# Patient Record
Sex: Male | Born: 1965 | Race: White | Hispanic: No | Marital: Married | Smoking: Never smoker
Health system: Southern US, Community
[De-identification: ages and names within clinical notes are randomized; demographics above are authoritative.]

## PROBLEM LIST (undated history)

## (undated) DIAGNOSIS — B019 Varicella without complication: Secondary | ICD-10-CM

## (undated) DIAGNOSIS — I639 Cerebral infarction, unspecified: Secondary | ICD-10-CM

## (undated) DIAGNOSIS — T7840XA Allergy, unspecified, initial encounter: Secondary | ICD-10-CM

## (undated) DIAGNOSIS — Z789 Other specified health status: Secondary | ICD-10-CM

## (undated) HISTORY — DX: Cerebral infarction, unspecified: I63.9

## (undated) HISTORY — DX: Allergy, unspecified, initial encounter: T78.40XA

## (undated) HISTORY — DX: Varicella without complication: B01.9

## (undated) HISTORY — PX: MOUTH SURGERY: SHX715

## (undated) HISTORY — PX: SKIN LESION EXCISION: SHX2412

---

## 2005-07-07 LAB — HM HIV SCREENING LAB: HM HIV SCREENING: NEGATIVE

## 2008-05-07 LAB — HM HIV SCREENING LAB: HM HIV Screening: NEGATIVE

## 2008-12-09 ENCOUNTER — Ambulatory Visit: Payer: Self-pay | Admitting: Family Medicine

## 2009-12-05 HISTORY — PX: COLONOSCOPY: SHX174

## 2010-01-29 ENCOUNTER — Ambulatory Visit: Payer: Self-pay | Admitting: Gastroenterology

## 2013-11-25 DIAGNOSIS — D229 Melanocytic nevi, unspecified: Secondary | ICD-10-CM

## 2013-11-25 HISTORY — DX: Melanocytic nevi, unspecified: D22.9

## 2016-03-28 ENCOUNTER — Telehealth: Payer: Self-pay | Admitting: Family Medicine

## 2016-03-28 NOTE — Telephone Encounter (Signed)
Please review-aa 

## 2016-03-28 NOTE — Telephone Encounter (Signed)
Today at 2 or  sometime tomorrow.

## 2016-03-28 NOTE — Telephone Encounter (Signed)
Pt has not been seen since 06/17/2012.  Pt thinks he may have a broken pinky finger and is requesting to see Dr Rosanna Randy.  Pt has not seen any other provider.  No Rx.  BCBS.  [id number]

## 2016-03-28 NOTE — Telephone Encounter (Signed)
Appointment scheduled for 03/29/2016@2 :30/MW

## 2016-03-28 NOTE — Telephone Encounter (Signed)
Please schedule.-aa 

## 2016-03-29 ENCOUNTER — Encounter: Payer: Self-pay | Admitting: Family Medicine

## 2016-03-29 ENCOUNTER — Ambulatory Visit (INDEPENDENT_AMBULATORY_CARE_PROVIDER_SITE_OTHER): Payer: BC Managed Care – PPO | Admitting: Family Medicine

## 2016-03-29 VITALS — BP 142/80 | HR 82 | Temp 98.0°F | Resp 14 | Ht 70.0 in | Wt 211.0 lb

## 2016-03-29 DIAGNOSIS — S63619A Unspecified sprain of unspecified finger, initial encounter: Secondary | ICD-10-CM | POA: Diagnosis not present

## 2016-03-29 DIAGNOSIS — S86002A Unspecified injury of left Achilles tendon, initial encounter: Secondary | ICD-10-CM

## 2016-03-29 MED ORDER — NAPROXEN 500 MG PO TABS: 500.0000 mg | ORAL_TABLET | Freq: Two times a day (BID) | ORAL | Status: DC

## 2016-03-29 NOTE — Progress Notes (Signed)
Patient ID: Nathaniel Gonzalez, male   DOB: December 11, 1965, 50 y.o.   MRN: [id number]    Subjective:  HPI Pt is re-establishing care today. 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 It is his left pinky finger. It is swollen and bruised/red. He can not bend it all the way or make a fist. It happened April 1st. He heard the finger pop,and crack.  He is also having pain on the left achilles area. 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 He could not walk right for about 4-5 days. He walked on the ball of his foot and did not "use" the tendon. It is better now but when he gets up out of a chair or when he gets up in the AM he can feel a pull on the back of his foot. Pt is concerned about injuring his foot further.     Prior to Admission medications   Not on File    Patient Active Problem List   Diagnosis Date Noted  . 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 Family history of colonic polyps 11/12/2008    History reviewed. No pertinent past medical history.  Social History   Social History  . Marital Status: Married    Spouse Name: N/A  . Number of Children: N/A  . Years of Education: N/A   Occupational History  . Not on file.   Social History Main Topics  . Smoking status: Never Smoker   . Smokeless tobacco: Not on file  . Alcohol Use: No  . Drug Use: No  . Sexual Activity: Not on file   Other Topics Concern  . Not on file   Social History Narrative    Allergies  Allergen Reactions  . Erythromycin   . Ketoconazole Rash    Review of Systems  Constitutional: Negative.   HENT: Negative.   Eyes: Negative.   Respiratory: Negative.   Cardiovascular: Negative.   Gastrointestinal: Negative.   Genitourinary: Negative.   Musculoskeletal: Positive for joint pain.  Skin: Negative.   Neurological: Negative.   Endo/Heme/Allergies: Negative.   Psychiatric/Behavioral: Negative.      Immunization History  Administered Date(s) Administered  . Tdap 11/12/2008   Objective:  BP 142/80 mmHg  Pulse 82  Temp(Src) 98 F (36.7 C) (Oral)  Resp 14  Ht 5\' 10"  (1.778 m)  Wt 211 lb (95.709 kg)  BMI 30.28 kg/m2  Physical Exam  Constitutional: He is oriented to person, place, and time and well-developed, well-nourished, and in no distress.  Eyes: Conjunctivae and EOM are normal. Pupils are equal, round, and reactive to light.  Neck: Normal range of motion. Neck supple.  Cardiovascular: Normal rate, regular rhythm, normal heart sounds and intact distal pulses.   Pulmonary/Chest: Effort normal and breath sounds normal.  Musculoskeletal: Normal range of motion. He exhibits edema (Fifth PIP joint swelling on left hand. Mild swelling of left achilles tendon).  Neurological: He is alert and oriented to person, place, and time. He has normal reflexes. Gait normal. GCS score is 15.  Skin: Skin is warm and dry.  Psychiatric: Mood, memory, affect and judgment normal.    No results found for: WBC, HGB, HCT, PLT, GLUCOSE, CHOL, TRIG, HDL, LDLDIRECT, LDLCALC, TSH, PSA, INR, GLUF, HGBA1C, MICROALBUR  CMP  No results found for: NA, K, CL, CO2, GLUCOSE, BUN, CREATININE, CALCIUM, PROT, ALBUMIN, AST, ALT, ALKPHOS, BILITOT, GFRNONAA, GFRAA  Assessment and Plan :  1. Finger sprain, initial encounter  - naproxen (NAPROSYN) 500 MG tablet; Take 1 tablet (500 mg total) by mouth 2 (two) times daily with a meal.  Dispense: 60 tablet; Refill: 5 - Ambulatory referral to Sports Medicine  2. Injury of left Achilles tendon, initial encounter  - Ambulatory referral to Sports Medicine   Patient was seen and examined by Dr. Miguel Aschoff, and noted scribed by Webb Laws, Dudley MD [facility] 03/29/2016 3:04 PM

## 2016-06-09 ENCOUNTER — Encounter: Payer: Self-pay | Admitting: Family Medicine

## 2016-06-09 ENCOUNTER — Ambulatory Visit (INDEPENDENT_AMBULATORY_CARE_PROVIDER_SITE_OTHER): Payer: BC Managed Care – PPO | Admitting: Family Medicine

## 2016-06-09 VITALS — BP 124/80 | HR 84 | Temp 98.0°F | Resp 16 | Ht 70.5 in | Wt 213.0 lb

## 2016-06-09 DIAGNOSIS — Z8371 Family history of colonic polyps: Secondary | ICD-10-CM

## 2016-06-09 DIAGNOSIS — Z0000 Encounter for general adult medical examination without abnormal findings: Secondary | ICD-10-CM

## 2016-06-09 DIAGNOSIS — Z125 Encounter for screening for malignant neoplasm of prostate: Secondary | ICD-10-CM | POA: Diagnosis not present

## 2016-06-09 DIAGNOSIS — R002 Palpitations: Secondary | ICD-10-CM | POA: Diagnosis not present

## 2016-06-09 NOTE — Progress Notes (Addendum)
Patient: Nathaniel Gonzalez, Male    DOB: [birth year], 50 y.o.   MRN: [id number] Visit Date: 06/09/2016  Today's Provider: Wilhemena Durie, MD   Chief Complaint  Patient presents with  . Annual Exam   Subjective:  Nathaniel Gonzalez is a 50 y.o. male who presents today for health maintenance and complete physical. He feels well. He reports exercising none. He reports he is sleeping well. Overall he feels well. His mother died this year from cancer. She was about 72. His father wa.s a heavy smoker and has severe ASCVD and COPD. Overall he feels well and wants to get plugged back in for health maintenance he is he has not done this for several years. His only real concerns today are occasional mild palpitations. He states that he occasionally gets "prostate infections". Also, as he is aged he has double cleaning up after bowel movements.   Review of Systems  Constitutional: Negative for appetite change.  Eyes: Negative.   Respiratory: Negative.   Cardiovascular: Positive for palpitations (Has had for years, more frequent over the last few years).  Gastrointestinal: Negative.   Endocrine: Negative.   Genitourinary: Negative.   Musculoskeletal: Negative.   Skin: Negative.   Allergic/Immunologic: Positive for environmental allergies and food allergies.  Neurological: Positive for headaches (allergy related).  Hematological: Negative.   Psychiatric/Behavioral: Negative.     Social History   Social History  . Marital Status: Married    Spouse Name: N/A  . Number of Children: N/A  . Years of Education: N/A   Occupational History  . Not on file.   Social History Main Topics  . Smoking status: Never Smoker   . Smokeless tobacco: Not on file  . Alcohol Use: No  . Drug Use: No  . Sexual Activity: Not on file   Other Topics Concern  . Not on file   Social History Narrative    Patient Active Problem List   Diagnosis Date Noted  . 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 Family  history of colonic polyps 11/12/2008    Past Surgical History  Procedure Laterality Date  . Mouth surgery    . Skin lesion excision      several, he goes yearly    His family history includes Aneurysm in his father; COPD in his father; Cancer in his cousin, maternal aunt, maternal grandmother, mother, and paternal grandmother; Colon polyps in his father and mother; Heart attack in his father; Heart disease in his father, maternal grandmother, and paternal grandfather; Hypertension in his brother; Skin cancer in his mother.    Outpatient Prescriptions Prior to Visit  Medication Sig Dispense Refill  . fluticasone (FLONASE) 50 MCG/ACT nasal spray Place 2 sprays into both nostrils daily. Reported on 06/09/2016    . naproxen (NAPROSYN) 500 MG tablet Take 1 tablet (500 mg total) by mouth 2 (two) times daily with a meal. (Patient not taking: Reported on 06/09/2016) 60 tablet 5   No facility-administered medications prior to visit.    Patient Care Team: Jerrol Banana., MD as PCP - General (Family Medicine)     Objective:   Vitals:  Filed Vitals:   06/09/16 0950  BP: 124/80  Pulse: 84  Temp: 98 F (36.7 C)  TempSrc: Oral  Resp: 16  Height: 5' 10.5" (1.791 m)  Weight: 213 lb (96.616 kg)    Physical Exam  Constitutional: He is oriented to person, place, and time. He appears well-developed and well-nourished.  HENT:  Head: Normocephalic and atraumatic.  Right Ear: External ear normal.  Left Ear: External ear normal.  Nose: Nose normal.  Mouth/Throat: Oropharynx is clear and moist.  Eyes: Conjunctivae and EOM are normal. Pupils are equal, round, and reactive to light.  Neck: Normal range of motion. Neck supple.  Cardiovascular: Normal rate, regular rhythm, normal heart sounds and intact distal pulses.   Pulmonary/Chest: Effort normal and breath sounds normal.  Abdominal: Soft. Bowel sounds are normal.  Genitourinary: Rectum normal, prostate normal and penis normal.   Musculoskeletal: Normal range of motion.  Neurological: He is alert and oriented to person, place, and time. He has normal reflexes.  Skin: Skin is warm and dry.  Psychiatric: He has a normal mood and affect. His behavior is normal. Judgment and thought content normal.     Depression Screen No flowsheet data found.    Assessment & Plan:     Routine Health Maintenance and Physical Exam  Exercise Activities and Dietary recommendations Goals    None      Immunization History  Administered Date(s) Administered  . Tdap 11/12/2008    Health Maintenance  Topic Date Due  . HIV Screening  10/08/1982  . INFLUENZA VACCINE  07/05/2016  . TETANUS/TDAP  11/12/2018   1. Annual physical exam  - CBC with Differential/Platelet - Comprehensive metabolic panel - Lipid Panel With LDL/HDL Ratio - TSH  2. Prostate cancer screening  - PSA  3. Palpitations Benign. Refer to cardiology at any point in time if patient is concerned. - EKG 12-Lead Start ASA 81 mg daily.  Return to clinic 1 year. Discussed health benefits of physical activity, and encouraged him to engage in regular exercise appropriate for his age and condition.  4.BPH 5.Colonic Polyps Per pt history--refer back to GI.  I have done the exam and reviewed the above chart and it is accurate to the best of my knowledge.  ------------------------------------------------------------------------------------------------------------

## 2016-06-09 NOTE — Patient Instructions (Signed)
Take Aspirin 81 mg daily  Take Metamucil daily for bowels  Referral being made to [facility] for follow up Colonoscopy

## 2016-06-10 ENCOUNTER — Telehealth: Payer: Self-pay

## 2016-06-10 LAB — CBC WITH DIFFERENTIAL/PLATELET
BASOS: 1 %
Basophils Absolute: 0 10*3/uL (ref 0.0–0.2)
EOS (ABSOLUTE): 0.2 10*3/uL (ref 0.0–0.4)
EOS: 3 %
HEMATOCRIT: 45.6 % (ref 37.5–51.0)
Hemoglobin: 16.4 g/dL (ref 12.6–17.7)
Immature Grans (Abs): 0 10*3/uL (ref 0.0–0.1)
Immature Granulocytes: 0 %
LYMPHS ABS: 2.2 10*3/uL (ref 0.7–3.1)
Lymphs: 38 %
MCH: 30.8 pg (ref 26.6–33.0)
MCHC: 36 g/dL — AB (ref 31.5–35.7)
MCV: 86 fL (ref 79–97)
MONOS ABS: 0.5 10*3/uL (ref 0.1–0.9)
Monocytes: 8 %
Neutrophils Absolute: 2.9 10*3/uL (ref 1.4–7.0)
Neutrophils: 50 %
Platelets: 219 10*3/uL (ref 150–379)
RBC: 5.33 x10E6/uL (ref 4.14–5.80)
RDW: 12.6 % (ref 12.3–15.4)
WBC: 5.8 10*3/uL (ref 3.4–10.8)

## 2016-06-10 LAB — COMPREHENSIVE METABOLIC PANEL
A/G RATIO: 1.8 (ref 1.2–2.2)
ALK PHOS: 64 IU/L (ref 39–117)
ALT: 40 IU/L (ref 0–44)
AST: 23 IU/L (ref 0–40)
Albumin: 4.6 g/dL (ref 3.5–5.5)
BILIRUBIN TOTAL: 0.8 mg/dL (ref 0.0–1.2)
BUN/Creatinine Ratio: 14 (ref 9–20)
BUN: 16 mg/dL (ref 6–24)
CO2: 25 mmol/L (ref 18–29)
Calcium: 9.7 mg/dL (ref 8.7–10.2)
Chloride: 102 mmol/L (ref 96–106)
Creatinine, Ser: 1.14 mg/dL (ref 0.76–1.27)
GFR calc Af Amer: 87 mL/min/{1.73_m2} (ref >59)
GFR calc non Af Amer: 76 mL/min/{1.73_m2} (ref >59)
GLOBULIN, TOTAL: 2.6 g/dL (ref 1.5–4.5)
Glucose: 94 mg/dL (ref 65–99)
POTASSIUM: 4.4 mmol/L (ref 3.5–5.2)
SODIUM: 143 mmol/L (ref 134–144)
Total Protein: 7.2 g/dL (ref 6.0–8.5)

## 2016-06-10 LAB — LIPID PANEL WITH LDL/HDL RATIO
CHOLESTEROL TOTAL: 213 mg/dL — AB (ref 100–199)
HDL: 37 mg/dL — ABNORMAL LOW (ref >39)
LDL Calculated: 127 mg/dL — ABNORMAL HIGH (ref 0–99)
LDl/HDL Ratio: 3.4 ratio units (ref 0.0–3.6)
TRIGLYCERIDES: 245 mg/dL — AB (ref 0–149)
VLDL Cholesterol Cal: 49 mg/dL — ABNORMAL HIGH (ref 5–40)

## 2016-06-10 LAB — TSH: TSH: 1.92 u[IU]/mL (ref 0.450–4.500)

## 2016-06-10 LAB — PSA: Prostate Specific Ag, Serum: 1.4 ng/mL (ref 0.0–4.0)

## 2016-06-10 NOTE — Addendum Note (Signed)
Addended by: Miguel Aschoff on: 06/10/2016 06:44 AM   Modules accepted: Miquel Dunn

## 2016-06-10 NOTE — Telephone Encounter (Signed)
Patient advised as below.  

## 2016-06-10 NOTE — Addendum Note (Signed)
Addended by: Miguel Aschoff on: 06/10/2016 06:46 AM   Modules accepted: Miquel Dunn

## 2016-06-10 NOTE — Telephone Encounter (Signed)
-----   Message from Jerrol Banana., MD sent at 06/10/2016  9:35 AM EDT ----- Labs okay. Work on diet and exercise to improve lipid profile

## 2016-06-17 ENCOUNTER — Telehealth: Payer: Self-pay

## 2016-06-17 NOTE — Telephone Encounter (Signed)
Patient will call me back to schedule a colonoscopy

## 2016-06-23 ENCOUNTER — Telehealth: Payer: Self-pay

## 2016-06-23 NOTE — Telephone Encounter (Signed)
Patient needs a colonoscopy for family history of polyps Z83.71. I tried calling his phone numbers but they are either disconnected or have a full voicemail. Sent patient a notification letter to the address provided on file.

## 2016-06-28 ENCOUNTER — Telehealth: Payer: Self-pay

## 2016-06-28 ENCOUNTER — Other Ambulatory Visit: Payer: Self-pay

## 2016-06-28 NOTE — Telephone Encounter (Signed)
Colonoscopy & Endoscopy scheduled 07/25/2016 [facility]  Screening Colonoscopy Z12.11 Dysphagia [id number]  Please pre cert

## 2016-06-28 NOTE — Telephone Encounter (Signed)
Gastroenterology Pre-Procedure Review  Request Date: 07/25/2016 Requesting Physician:  PATIENT REVIEW QUESTIONS: The patient responded to the following health history questions as indicated:    1. Are you having any GI issues? yes (Every now and then he has some rectal bleeding ) 2. Do you have a personal history of Polyps? no 3. Do you have a family history of Colon Cancer or Polyps? yes (mother, benign polyps ) 4. Diabetes Mellitus? no 5. Joint replacements in the past 12 months?no 6. Major health problems in the past 3 months?no 7. Any artificial heart valves, MVP, or defibrillator?no    MEDICATIONS & ALLERGIES:    Patient reports the following regarding taking any anticoagulation/antiplatelet therapy:   Plavix, Coumadin, Eliquis, Xarelto, Lovenox, Pradaxa, Brilinta, or Effient? no Aspirin? yes (Heart health )  Patient confirms/reports the following medications:  Current Outpatient Prescriptions  Medication Sig Dispense Refill  . aspirin 81 MG tablet Take 81 mg by mouth daily.    . fluticasone (FLONASE) 50 MCG/ACT nasal spray Place 2 sprays into both nostrils daily. Reported on 06/09/2016    . ketoconazole (NIZORAL) 2 % shampoo      No current facility-administered medications for this visit.     Patient confirms/reports the following allergies:  Allergies  Allergen Reactions  . Erythromycin     No orders of the defined types were placed in this encounter.   AUTHORIZATION INFORMATION Primary Insurance: 1D#: Group #:  Secondary Insurance: 1D#: Group #:  SCHEDULE INFORMATION: Date: 07/25/2016 Time: Location: [facility]

## 2016-07-06 ENCOUNTER — Observation Stay
Admit: 2016-07-06 | Discharge: 2016-07-06 | Disposition: A | Discharge status: Home / Self Care | Payer: BC Managed Care – PPO | Attending: Internal Medicine | Admitting: Internal Medicine

## 2016-07-06 ENCOUNTER — Inpatient Hospital Stay
Admission: EM | Admit: 2016-07-06 | Discharge: 2016-07-08 | DRG: 066 | Disposition: A | Discharge status: Home / Self Care | Payer: BC Managed Care – PPO | Attending: Internal Medicine | Admitting: Internal Medicine

## 2016-07-06 ENCOUNTER — Encounter: Payer: Self-pay | Admitting: *Deleted

## 2016-07-06 ENCOUNTER — Emergency Department: Payer: BC Managed Care – PPO

## 2016-07-06 ENCOUNTER — Encounter: Payer: Self-pay | Admitting: Family Medicine

## 2016-07-06 ENCOUNTER — Observation Stay: Payer: BC Managed Care – PPO

## 2016-07-06 ENCOUNTER — Ambulatory Visit (INDEPENDENT_AMBULATORY_CARE_PROVIDER_SITE_OTHER): Payer: BC Managed Care – PPO | Admitting: Family Medicine

## 2016-07-06 VITALS — BP 152/84 | HR 74 | Temp 98.1°F | Resp 20 | Wt 212.0 lb

## 2016-07-06 DIAGNOSIS — R42 Dizziness and giddiness: Secondary | ICD-10-CM

## 2016-07-06 DIAGNOSIS — R112 Nausea with vomiting, unspecified: Secondary | ICD-10-CM | POA: Diagnosis present

## 2016-07-06 DIAGNOSIS — I639 Cerebral infarction, unspecified: Secondary | ICD-10-CM | POA: Diagnosis not present

## 2016-07-06 DIAGNOSIS — Z881 Allergy status to other antibiotic agents status: Secondary | ICD-10-CM

## 2016-07-06 DIAGNOSIS — R29898 Other symptoms and signs involving the musculoskeletal system: Secondary | ICD-10-CM | POA: Diagnosis present

## 2016-07-06 DIAGNOSIS — R270 Ataxia, unspecified: Secondary | ICD-10-CM | POA: Diagnosis present

## 2016-07-06 DIAGNOSIS — Z8249 Family history of ischemic heart disease and other diseases of the circulatory system: Secondary | ICD-10-CM

## 2016-07-06 DIAGNOSIS — Z79899 Other long term (current) drug therapy: Secondary | ICD-10-CM

## 2016-07-06 DIAGNOSIS — R7301 Impaired fasting glucose: Secondary | ICD-10-CM | POA: Diagnosis present

## 2016-07-06 DIAGNOSIS — G8321 Monoplegia of upper limb affecting right dominant side: Secondary | ICD-10-CM | POA: Diagnosis present

## 2016-07-06 DIAGNOSIS — Z808 Family history of malignant neoplasm of other organs or systems: Secondary | ICD-10-CM

## 2016-07-06 DIAGNOSIS — H6691 Otitis media, unspecified, right ear: Secondary | ICD-10-CM | POA: Diagnosis present

## 2016-07-06 DIAGNOSIS — Z7982 Long term (current) use of aspirin: Secondary | ICD-10-CM

## 2016-07-06 HISTORY — DX: Other specified health status: Z78.9

## 2016-07-06 LAB — URINE DRUG SCREEN, QUALITATIVE (ARMC ONLY)
AMPHETAMINES, UR SCREEN: NOT DETECTED
BENZODIAZEPINE, UR SCRN: NOT DETECTED
Barbiturates, Ur Screen: NOT DETECTED
Cannabinoid 50 Ng, Ur ~~LOC~~: NOT DETECTED
Cocaine Metabolite,Ur ~~LOC~~: NOT DETECTED
MDMA (ECSTASY) UR SCREEN: NOT DETECTED
Methadone Scn, Ur: NOT DETECTED
Opiate, Ur Screen: NOT DETECTED
PHENCYCLIDINE (PCP) UR S: NOT DETECTED
TRICYCLIC, UR SCREEN: NOT DETECTED

## 2016-07-06 LAB — URINALYSIS COMPLETE WITH MICROSCOPIC (ARMC ONLY)
BACTERIA UA: NONE SEEN
Bilirubin Urine: NEGATIVE
Glucose, UA: NEGATIVE mg/dL
Hgb urine dipstick: NEGATIVE
KETONES UR: NEGATIVE mg/dL
Leukocytes, UA: NEGATIVE
Nitrite: NEGATIVE
PH: 6 (ref 5.0–8.0)
PROTEIN: NEGATIVE mg/dL
RBC / HPF: NONE SEEN RBC/hpf (ref 0–5)
SQUAMOUS EPITHELIAL / LPF: NONE SEEN
Specific Gravity, Urine: 1.014 (ref 1.005–1.030)

## 2016-07-06 LAB — CBC
HEMATOCRIT: 47.7 % (ref 40.0–52.0)
HEMOGLOBIN: 17.2 g/dL (ref 13.0–18.0)
MCH: 30.6 pg (ref 26.0–34.0)
MCHC: 36.1 g/dL — AB (ref 32.0–36.0)
MCV: 84.9 fL (ref 80.0–100.0)
Platelets: 251 10*3/uL (ref 150–440)
RBC: 5.62 MIL/uL (ref 4.40–5.90)
RDW: 12.7 % (ref 11.5–14.5)
WBC: 8.3 10*3/uL (ref 3.8–10.6)

## 2016-07-06 LAB — BASIC METABOLIC PANEL
ANION GAP: 10 (ref 5–15)
BUN: 13 mg/dL (ref 6–20)
CALCIUM: 9.4 mg/dL (ref 8.9–10.3)
CHLORIDE: 104 mmol/L (ref 101–111)
CO2: 23 mmol/L (ref 22–32)
Creatinine, Ser: 1.17 mg/dL (ref 0.61–1.24)
GFR calc non Af Amer: >60 mL/min (ref >60)
GLUCOSE: 125 mg/dL — AB (ref 65–99)
Potassium: 3.6 mmol/L (ref 3.5–5.1)
Sodium: 137 mmol/L (ref 135–145)

## 2016-07-06 LAB — HEMOGLOBIN A1C: Hgb A1c MFr Bld: 4.8 % (ref 4.0–6.0)

## 2016-07-06 MED ORDER — ENOXAPARIN SODIUM 40 MG/0.4ML ~~LOC~~ SOLN
SUBCUTANEOUS | Status: AC
  Administered 2016-07-06: 40 mg via SUBCUTANEOUS
  Filled 2016-07-06: qty 0.4

## 2016-07-06 MED ORDER — SODIUM CHLORIDE 0.9% FLUSH
3.0000 mL | Freq: Two times a day (BID) | INTRAVENOUS | Status: DC
  Administered 2016-07-06 – 2016-07-08 (×3): 3 mL via INTRAVENOUS

## 2016-07-06 MED ORDER — SODIUM CHLORIDE 0.9 % IV SOLN
INTRAVENOUS | Status: DC
  Administered 2016-07-06 – 2016-07-07 (×3): via INTRAVENOUS

## 2016-07-06 MED ORDER — AMOXICILLIN-POT CLAVULANATE 875-125 MG PO TABS
ORAL_TABLET | ORAL | Status: AC
  Administered 2016-07-06: 1 via ORAL
  Filled 2016-07-06: qty 1

## 2016-07-06 MED ORDER — FLUTICASONE PROPIONATE 50 MCG/ACT NA SUSP
2.0000 | Freq: Every day | NASAL | Status: DC | PRN
  Filled 2016-07-06: qty 16

## 2016-07-06 MED ORDER — AMOXICILLIN-POT CLAVULANATE 875-125 MG PO TABS
1.0000 | ORAL_TABLET | Freq: Two times a day (BID) | ORAL | Status: DC
  Administered 2016-07-06 – 2016-07-08 (×5): 1 via ORAL
  Filled 2016-07-06 (×4): qty 1

## 2016-07-06 MED ORDER — ASPIRIN 81 MG PO CHEW
324.0000 mg | CHEWABLE_TABLET | Freq: Once | ORAL | Status: AC
  Administered 2016-07-06: 324 mg via ORAL
  Filled 2016-07-06: qty 4

## 2016-07-06 MED ORDER — ENOXAPARIN SODIUM 40 MG/0.4ML ~~LOC~~ SOLN
40.0000 mg | SUBCUTANEOUS | Status: DC
  Administered 2016-07-06 – 2016-07-07 (×2): 40 mg via SUBCUTANEOUS
  Filled 2016-07-06: qty 0.4

## 2016-07-06 MED ORDER — ASPIRIN EC 81 MG PO TBEC
81.0000 mg | DELAYED_RELEASE_TABLET | Freq: Every day | ORAL | Status: DC
  Administered 2016-07-07 – 2016-07-08 (×2): 81 mg via ORAL
  Filled 2016-07-06 (×2): qty 1

## 2016-07-06 MED ORDER — ACETAMINOPHEN 650 MG RE SUPP: 650.0000 mg | Freq: Four times a day (QID) | RECTAL | Status: DC | PRN

## 2016-07-06 MED ORDER — ONDANSETRON HCL 4 MG/2ML IJ SOLN
4.0000 mg | Freq: Four times a day (QID) | INTRAMUSCULAR | Status: DC | PRN
  Administered 2016-07-06 – 2016-07-08 (×3): 4 mg via INTRAVENOUS
  Filled 2016-07-06 (×3): qty 2

## 2016-07-06 MED ORDER — ACETAMINOPHEN 325 MG PO TABS
650.0000 mg | ORAL_TABLET | Freq: Four times a day (QID) | ORAL | Status: DC | PRN
  Administered 2016-07-06 – 2016-07-07 (×3): 650 mg via ORAL
  Filled 2016-07-06 (×3): qty 2

## 2016-07-06 NOTE — ED Triage Notes (Addendum)
States yesterday at 4pm he became dizzy, states he felt himself falling and leaning to the right but never fell, states "my body isnt doing what I want it to do", states 1 episiode of vomiting last night, states 3 am today he felt right arm numbness, at present pt has right arm numbness

## 2016-07-06 NOTE — ED Notes (Signed)
MD at bedside. 

## 2016-07-06 NOTE — ED Provider Notes (Signed)
[facility] Emergency Department Provider Note  ____________________________________________   First MD Initiated Contact with Patient 07/06/16 1210     (approximate)  I have reviewed the triage vital signs and the nursing notes.   HISTORY  Chief Complaint Dizziness    HPI Nathaniel Gonzalez is a 50 y.o. male patient reports about 4:00 yesterday he got some pressure in his head down by several 8:00 he began feeling he was leaning to the right. At 3:00 in the morning he woke up and his right arm was numb this numbness is progressed and now the whole arm is numb in the right side of his face is numb his leg is feeling okay. He went to see Dr. Rosanna Randy and was found to be ataxic in the right arm. Dr. Rosanna Randy sent him here. Despite what it says in the medical list patient is not currently taking aspirin.   Past Medical History:  Diagnosis Date  . Allergy   . Chicken pox   Migraines  Patient Active Problem List   Diagnosis Date Noted  . 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 Family history of colonic polyps 11/12/2008    Past Surgical History:  Procedure Laterality Date  . MOUTH SURGERY    . SKIN LESION EXCISION     several, he goes yearly    Prior to Admission medications   Medication Sig Start Date End Date Taking? Authorizing Provider  aspirin 81 MG tablet Take 81 mg by mouth daily.    Historical Provider, MD  fluticasone (FLONASE) 50 MCG/ACT nasal spray Place 2 sprays into both nostrils daily. Reported on 06/09/2016    Historical Provider, MD  ketoconazole (NIZORAL) 2 % shampoo  06/27/16   Historical Provider, MD    Allergies Erythromycin  Family History  Problem Relation Age of Onset  . Skin cancer Mother   . Colon polyps Mother   . Cancer Mother     skin, ureter cancer  . Heart attack Father   . COPD Father   . Aneurysm Father   . Colon polyps Father   . Heart disease Father   . Hypertension Brother   . Cancer Maternal Aunt     skin    . Cancer Maternal Grandmother     unknown type  . Heart disease Maternal Grandmother   . Cancer Paternal Grandmother     liver  . Heart disease Paternal Grandfather     died age 10 MI  . Cancer Cousin     skin    Social History Social History  Substance Use Topics  . Smoking status: Never Smoker  . Smokeless tobacco: Not on file  . Alcohol use No    Review of Systems Constitutional: No fever/chills Eyes: No visual changes. ENT: No sore throat. Cardiovascular: Denies chest pain. Respiratory: Denies shortness of breath. Gastrointestinal: No abdominal pain.  No nausea, no vomiting.  No diarrhea.  No constipation. Genitourinary: Negative for dysuria. Musculoskeletal: Negative for back pain. Skin: Negative for rash. Neurological: See history of present illness  10-point ROS otherwise negative.  ____________________________________________   PHYSICAL EXAM:  VITAL SIGNS: ED Triage Vitals [07/06/16 1135]  Enc Vitals Group     BP (!) 156/94     Pulse Rate 95     Resp 18     Temp 98.3 F (36.8 C)     Temp Source Oral     SpO2 99 %     Weight 212 lb (96.2 kg)     Height  5\' 10"  (1.778 m)     Head Circumference      Peak Flow      Pain Score      Pain Loc      Pain Edu?      Excl. in [place]?    Constitutional: Alert and oriented. Well appearing and in no acute distress. Eyes: Conjunctivae are normal. PERRL. EOMI. Head: Atraumatic. Nose: No congestion/rhinnorhea. Mouth/Throat: Mucous membranes are moist.  Oropharynx non-erythematous. Neck: No stridor.   Cardiovascular: Normal rate, regular rhythm. Grossly normal heart sounds.  Good peripheral circulation. Respiratory: Normal respiratory effort.  No retractions. Lungs CTAB. Gastrointestinal: Soft and nontender. No distention. No abdominal bruits. No CVA tenderness. Musculoskeletal: No lower extremity tenderness nor edema.  No joint effusions. Neurologic:  Normal speech and language. Cranial nerves II through XII  appear to be intact. This includes visual fields by confrontation. Cerebellar finger to nose is somewhat slowed and slightly off on the right. Rapid alternating movements on the right are slightly slowed compared to the left. Motor strength is 5 over 5 throughout the right arm and neck and part of the side of the face is tingly. Skin:  Skin is warm, dry and intact. No rash noted. Psychiatric: Mood and affect are normal. Speech and behavior are normal.  ____________________________________________   LABS (all labs ordered are listed, but only abnormal results are displayed)  Labs Reviewed  BASIC METABOLIC PANEL - Abnormal; Notable for the following:       Result Value   Glucose, Bld 125 (*)    All other components within normal limits  CBC - Abnormal; Notable for the following:    MCHC 36.1 (*)    All other components within normal limits  URINALYSIS COMPLETEWITH MICROSCOPIC ([facility] ONLY)  CBG MONITORING, ED   ____________________________________________  EKG  EKG shows normal sinus rhythm at a rate of 88 normal axis essentially normal ____________________________________________  RADIOLOGY  Radiology  read CT as negative  ____________________________________________   PROCEDURES    Procedures    ____________________________________________   INITIAL IMPRESSION / ASSESSMENT AND PLAN / ED COURSE  Pertinent labs & imaging results that were available during my care of the patient were reviewed by me and considered in my medical decision making (see chart for details).    Clinical Course     ____________________________________________   FINAL CLINICAL IMPRESSION(S) / ED DIAGNOSES  Final diagnoses:  Dizziness   Final diagnosis is not dizziness it is CVA as was put in the dispo part of this computer   NEW MEDICATIONS STARTED DURING THIS VISIT:  New Prescriptions   No medications on file     Note:  This document was prepared using Dragon voice  recognition software and may include unintentional dictation errors.    Nena Polio, MD 07/06/16 1340

## 2016-07-06 NOTE — Progress Notes (Signed)
       Patient: Nathaniel Gonzalez Male    DOB: July 29, 1966   50 y.o.   MRN: [id number] Visit Date: 07/06/2016  Today's Provider: Wilhemena Durie, MD   Chief Complaint  Patient presents with  . off balance   Subjective:    HPI Patient reports that he does have a pressure sensation in his head. Patient reports that since yesterday he has lost about 20% of usage in his right arm. Patient reports that he has some nausea. He reports that he is unable to walk steady, and moves towards the right. Patient denies any injury. He denies any tick or insect bites. Patient reports that he has had his symptoms since about 4pm yesterday.      Allergies  Allergen Reactions  . Erythromycin    Current Meds  Medication Sig  . aspirin 81 MG tablet Take 81 mg by mouth daily.  . fluticasone (FLONASE) 50 MCG/ACT nasal spray Place 2 sprays into both nostrils daily. Reported on 06/09/2016  . ketoconazole (NIZORAL) 2 % shampoo     Review of Systems  Constitutional: Positive for activity change, appetite change and fatigue.  HENT: Positive for congestion.   Eyes: Negative.   Respiratory: Negative.   Cardiovascular: Negative for chest pain, palpitations and leg swelling.  Gastrointestinal: Positive for nausea. Negative for diarrhea and vomiting.  Endocrine: Negative.   Skin: Negative.   Allergic/Immunologic: Negative.   Neurological: Positive for dizziness, weakness, light-headedness, numbness and headaches. Negative for tremors, syncope, facial asymmetry and speech difficulty.       Feels "off balance"   Hematological: Negative.   Psychiatric/Behavioral: Negative.    cerebellar function is abnormal as when standing up patient leaned/falls to the right. Finger to nose is normal on the left and abnormal on the right.  Social History  Substance Use Topics  . Smoking status: Never Smoker  . Smokeless tobacco: Not on file  . Alcohol use No   Objective:   BP (!) 152/84 (BP Location: Left Arm,  Patient Position: Sitting, Cuff Size: Normal)   Pulse 74   Temp 98.1 F (36.7 C)   Resp 20   Wt 212 lb (96.2 kg)   SpO2 95%   BMI 29.99 kg/m   Physical Exam  Constitutional: He is oriented to person, place, and time. He appears well-developed and well-nourished.  HENT:  Head: Normocephalic and atraumatic.  Right Ear: External ear normal.  Left Ear: External ear normal.  Nose: Nose normal.  Cardiovascular: Normal rate, regular rhythm and normal heart sounds.   Pulmonary/Chest: Effort normal and breath sounds normal.  Neurological: He is alert and oriented to person, place, and time. He has normal reflexes.  Hand grips equal bilaterally.         Assessment & Plan:     1. Dizziness  - EKG 12-Lead 2. Clinically I think the patient has had a stroke   with his young age will send immediately to the emergency department. Description has them outside of the 3 hour window for TPA. Will follow-up after his discharge from hospital.  I have done the exam and reviewed the above chart and it is accurate to the best of my knowledge.  Cassady Cranford Mon, MD  [facility]

## 2016-07-06 NOTE — ED Notes (Signed)
Patient transported to Ultrasound 

## 2016-07-06 NOTE — Plan of Care (Signed)
Problem: Safety: Goal: Ability to remain free from injury will improve Outcome: Not Progressing Pt tends to lean to rt side heavily at times  Problem: Education: Goal: Knowledge of disease or condition will improve Outcome: Progressing Stroke booklet given  Problem: Tissue Perfusion: Goal: Complications of Ischemic Stroke will be minimized. Choose ONE based on patient diagnosis Outcome: Progressing nih  1  Pt to have mri/ carotid and echo in am

## 2016-07-06 NOTE — H&P (Signed)
[facility] NAME: Nathaniel Gonzalez    MR#:  [id number]  DATE OF BIRTH:  07-07-1966  DATE OF ADMISSION:  07/06/2016  PRIMARY CARE PHYSICIAN: Wilhemena Durie, MD   REQUESTING/REFERRING PHYSICIAN: Dr Conni Slipper  CHIEF COMPLAINT:   Chief Complaint  Patient presents with  . Dizziness    HISTORY OF PRESENT ILLNESS:  Nathaniel Gonzalez  is a 50 y.o. male states that for the clock yesterday he felt a little lightheaded. Around 7 PM last night he felt weird like he was falling over to the right. At 8:30 PM almost walked into a walk as he was falling over to the right. He went to sleep around 5 AM he woke up with his right arm tingling. He went on for while on his right hand was still tingling. He went back to sleep and woke up around 8:30 AM in his right arm still felt like it was asleep. They called Dr. Alben Spittle office and they saw him today and referred into the ER for incoordination with his right hand with finger nose testing and with Romberg he fell over to the right. He did vomit here in the ER one time. Of note, he's been having a little neck pain and used a heating pad for the last 3 days. He also has a slight cough.  PAST MEDICAL HISTORY:   Past Medical History:  Diagnosis Date  . Allergy   . Chicken pox   . Medical history non-contributory     PAST SURGICAL HISTORY:   Past Surgical History:  Procedure Laterality Date  . MOUTH SURGERY    . SKIN LESION EXCISION     several, he goes yearly    SOCIAL HISTORY:   Social History  Substance Use Topics  . Smoking status: Never Smoker  . Smokeless tobacco: Never Used  . Alcohol use No    FAMILY HISTORY:   Family History  Problem Relation Age of Onset  . Skin cancer Mother   . Colon polyps Mother   . Cancer Mother     skin, ureter cancer  . Heart attack Father   . COPD Father   . Aneurysm Father   . Colon polyps Father   . Heart disease Father    . Hypertension Brother   . Cancer Maternal Aunt     skin  . Cancer Maternal Grandmother     unknown type  . Heart disease Maternal Grandmother   . Cancer Paternal Grandmother     liver  . Heart disease Paternal Grandfather     died age 72 MI  . Cancer Cousin     skin    DRUG ALLERGIES:   Allergies  Allergen Reactions  . Erythromycin Hives    REVIEW OF SYSTEMS:  CONSTITUTIONAL: No fever. Hot and cold feeling today. EYES: No blurred or double vision. Wears contacts. EARS, NOSE, AND THROAT: No sore throat. Has allergies and runny nose and some ear discomfort today RESPIRATORY: Some cough. No shortness of breath, wheezing or hemoptysis.  CARDIOVASCULAR: No chest pain, orthopnea, edema.  GASTROINTESTINAL: Some nausea. One time vomiting in the ER. Occasional diarrhea. No abdominal pain. No blood in bowel movements GENITOURINARY: No dysuria, hematuria.  ENDOCRINE: No polyuria, nocturia,  HEMATOLOGY: No anemia, easy bruising or bleeding SKIN: No rash or lesion. MUSCULOSKELETAL: Shoulder pain, Achilles pain left finger pain left arm pain NEUROLOGIC: No tingling, numbness, weakness.  PSYCHIATRY: No anxiety or depression.   MEDICATIONS AT  HOME:   Prior to Admission medications   Medication Sig Start Date End Date Taking? Authorizing Provider  fluticasone (FLONASE) 50 MCG/ACT nasal spray Place 2 sprays into both nostrils daily as needed. Reported on 06/09/2016    Historical Provider, MD      VITAL SIGNS:  Blood pressure (!) 155/98, pulse 89, temperature 98.3 F (36.8 C), resp. rate (!) 23, height 5\' 10"  (1.778 m), weight 96.2 kg (212 lb), SpO2 100 %.  PHYSICAL EXAMINATION:  GENERAL:  50 y.o.-year-old patient lying in the bed with no acute distress lying in the bed with no acute distress.  EYES: Pupils equal, round, reactive to light and accommodation. No scleral icterus. Extraocular muscles intact.  HEENT: Head atraumatic, normocephalic. Oropharynx and nasopharynx clear. Tympanic membrane right side bulging and  erythematous NECK:  Supple, no jugular venous distention. No thyroid enlargement. Slight tenderness right neck close to his right ear. Carotid pulses 2+ bilaterally. LUNGS: Normal breath sounds bilaterally, no wheezing, rales,rhonchi or crepitation. No use of accessory muscles of respiration.  CARDIOVASCULAR: S1, S2 normal. No murmurs, rubs, or gallops.  ABDOMEN: Soft, nontender, nondistended. Bowel sounds present. No organomegaly or mass.  EXTREMITIES: No pedal edema, cyanosis, or clubbing.  NEUROLOGIC: Cranial nerves II through XII are intact. Muscle strength 5/5 in all extremities. Gait not checked. Babinski negative bilaterally reflexes 2+ bilateral lower extremity. Finger nose slightly impaired right side. Normal left side. PSYCHIATRIC: The patient is alert and oriented x 3.  SKIN: No rash, lesion, or ulcer.   LABORATORY PANEL:   CBC  Recent Labs Lab 07/06/16 1138  WBC 8.3  HGB 17.2  HCT 47.7  PLT 251   ------------------------------------------------------------------------------------------------------------------  Chemistries   Recent Labs Lab 07/06/16 1138  NA 137  K 3.6  CL 104  CO2 23  GLUCOSE 125*  BUN 13  CREATININE 1.17  CALCIUM 9.4   ------------------------------------------------------------------------------------------------------------------    RADIOLOGY:  Ct Head Wo Contrast  Result Date: 07/06/2016 CLINICAL DATA:  Dizziness beginning yesterday. Patient felt he was falling. EXAM: CT HEAD WITHOUT CONTRAST TECHNIQUE: Contiguous axial images were obtained from the base of the skull through the vertex without intravenous contrast. COMPARISON:  None. FINDINGS: Brain: No evidence of acute infarction, hemorrhage, extra-axial collection, ventriculomegaly, or mass effect. Normal for age brain volume. No white matter disease of significance. Vascular: No hyperdense vessel or unexpected calcification. Skull: Negative for fracture or focal lesion. Sinuses/Orbits:  No acute findings. Other: None. IMPRESSION: Negative exam. Electronically Signed   By: Staci Righter M.D.   On: 07/06/2016 13:23    EKG:   Normal sinus rhythm 88 bpm  IMPRESSION AND PLAN:   1. Right arm incoordination, ataxia. Rule out cerebellar stroke. I will get stroke workup with MRI of the brain, carotid ultrasound and echocardiogram. Aspirin given in ER. The patient does have right otitis media and I will start Augmentin which could cause the ataxia. The patient also does have neck discomfort and I will also do an MRI of the cervical spine while he is here. 2. Elevated blood pressure without history of hypertension. Allow permissive hypertension at this point until I rule out stroke. 3. Impaired fasting glucose check a hemoglobin A1c 4. Nausea vomiting. When necessary Zofran. 5. Hemoconcentration with elevated hemoglobin oh give IV fluids and check a hemoglobin tomorrow morning.   All the records are reviewed and case discussed with ED provider. Management plans discussed with the patient, family and they are in agreement.  CODE STATUS: Full code  TOTAL TIME TAKING CARE OF THIS PATIENT: 50 minutes.  Loletha Grayer M.D on 07/06/2016 at 2:19 PM  Between 7am to 6pm - Pager - [phone]  After 6pm call admission pager [phone]  [facility] Office  [phone]  CC: Primary care physician; Wilhemena Durie, MD

## 2016-07-07 ENCOUNTER — Encounter: Payer: Self-pay | Admitting: Radiology

## 2016-07-07 ENCOUNTER — Observation Stay: Payer: BC Managed Care – PPO

## 2016-07-07 DIAGNOSIS — Z8249 Family history of ischemic heart disease and other diseases of the circulatory system: Secondary | ICD-10-CM | POA: Diagnosis not present

## 2016-07-07 DIAGNOSIS — R270 Ataxia, unspecified: Secondary | ICD-10-CM | POA: Diagnosis present

## 2016-07-07 DIAGNOSIS — R7301 Impaired fasting glucose: Secondary | ICD-10-CM | POA: Diagnosis present

## 2016-07-07 DIAGNOSIS — R29898 Other symptoms and signs involving the musculoskeletal system: Secondary | ICD-10-CM

## 2016-07-07 DIAGNOSIS — Z881 Allergy status to other antibiotic agents status: Secondary | ICD-10-CM | POA: Diagnosis not present

## 2016-07-07 DIAGNOSIS — R42 Dizziness and giddiness: Secondary | ICD-10-CM | POA: Diagnosis present

## 2016-07-07 DIAGNOSIS — I639 Cerebral infarction, unspecified: Secondary | ICD-10-CM | POA: Diagnosis present

## 2016-07-07 DIAGNOSIS — Z808 Family history of malignant neoplasm of other organs or systems: Secondary | ICD-10-CM | POA: Diagnosis not present

## 2016-07-07 DIAGNOSIS — H6691 Otitis media, unspecified, right ear: Secondary | ICD-10-CM | POA: Diagnosis present

## 2016-07-07 DIAGNOSIS — Z79899 Other long term (current) drug therapy: Secondary | ICD-10-CM | POA: Diagnosis not present

## 2016-07-07 DIAGNOSIS — R112 Nausea with vomiting, unspecified: Secondary | ICD-10-CM | POA: Diagnosis present

## 2016-07-07 DIAGNOSIS — G8321 Monoplegia of upper limb affecting right dominant side: Secondary | ICD-10-CM | POA: Diagnosis present

## 2016-07-07 DIAGNOSIS — Z7982 Long term (current) use of aspirin: Secondary | ICD-10-CM | POA: Diagnosis not present

## 2016-07-07 LAB — CBC
HCT: 41.5 % (ref 40.0–52.0)
Hemoglobin: 15.4 g/dL (ref 13.0–18.0)
MCH: 31.6 pg (ref 26.0–34.0)
MCHC: 37.1 g/dL — ABNORMAL HIGH (ref 32.0–36.0)
MCV: 85.2 fL (ref 80.0–100.0)
PLATELETS: 193 10*3/uL (ref 150–440)
RBC: 4.87 MIL/uL (ref 4.40–5.90)
RDW: 12.4 % (ref 11.5–14.5)
WBC: 7.9 10*3/uL (ref 3.8–10.6)

## 2016-07-07 LAB — LIPID PANEL
Cholesterol: 185 mg/dL (ref 0–200)
HDL: 29 mg/dL — AB (ref >40)
LDL Cholesterol: 116 mg/dL — ABNORMAL HIGH (ref 0–99)
TRIGLYCERIDES: 200 mg/dL — AB (ref <150)
Total CHOL/HDL Ratio: 6.4 RATIO
VLDL: 40 mg/dL (ref 0–40)

## 2016-07-07 LAB — BASIC METABOLIC PANEL
Anion gap: 3 — ABNORMAL LOW (ref 5–15)
BUN: 14 mg/dL (ref 6–20)
CALCIUM: 9 mg/dL (ref 8.9–10.3)
CO2: 29 mmol/L (ref 22–32)
CREATININE: 1.09 mg/dL (ref 0.61–1.24)
Chloride: 109 mmol/L (ref 101–111)
GFR calc Af Amer: >60 mL/min (ref >60)
Glucose, Bld: 114 mg/dL — ABNORMAL HIGH (ref 65–99)
Potassium: 3.8 mmol/L (ref 3.5–5.1)
SODIUM: 141 mmol/L (ref 135–145)

## 2016-07-07 LAB — ECHOCARDIOGRAM COMPLETE
HEIGHTINCHES: 70 in
WEIGHTICAEL: 3316.8 [oz_av]

## 2016-07-07 MED ORDER — ATORVASTATIN CALCIUM 20 MG PO TABS
40.0000 mg | ORAL_TABLET | Freq: Every day | ORAL | Status: DC
Start: 2016-07-07 — End: 2016-07-08
  Administered 2016-07-07: 18:00:00 40 mg via ORAL
  Filled 2016-07-07: qty 2

## 2016-07-07 MED ORDER — IOPAMIDOL (ISOVUE-370) INJECTION 76%
75.0000 mL | Freq: Once | INTRAVENOUS | Status: AC | PRN
  Administered 2016-07-07: 16:00:00 75 mL via INTRAVENOUS

## 2016-07-07 MED ORDER — GADOBENATE DIMEGLUMINE 529 MG/ML IV SOLN
20.0000 mL | Freq: Once | INTRAVENOUS | Status: AC | PRN
  Administered 2016-07-07: 13:00:00 20 mL via INTRAVENOUS

## 2016-07-07 NOTE — Plan of Care (Signed)
Problem: Pain Managment: Goal: General experience of comfort will improve Outcome: Progressing Tylenol 650mg  po given times 2 doses. Improvement noted.

## 2016-07-07 NOTE — Progress Notes (Signed)
Occupational Therapy Treatment Patient Details Name: Nathaniel Gonzalez MRN: [id number] DOB: 27-Sep-1966 Today's Date: 07/07/2016    History of present illness [name] is a 50 y.o. male states that for the clock yesterday he felt a little lightheaded. Around 7 PM last night he felt weird like he was falling over to the right. At 8:30 PM almost walked into a walk as he was falling over to the right. He went to sleep around 5 AM he woke up with his right arm tingling. He went on for while on his right hand was still tingling. He went back to sleep and woke up around 8:30 AM in his right arm still felt like it was asleep. They called Dr. Alben Spittle office and they saw him today and referred into the ER for incoordination with his right hand with finger nose testing and with Romberg he fell over to the right. He did vomit here in the ER one time. Of note, he's been having a little neck pain and used a heating pad for the last 3 days. He also has a slight cough. CT was negative and MRI to be completed today.   OT comments  Pt and wife seen for HEP program for RUE and hand using theraputty for improving coordination in R hand with written exercises.  Also given foam utensil holder for R hand for utensils and a large one for use at work for dry erase marker to write on [employer].  Discussed rec for Outpatient OT to [place] from [facility] and pt and wife in agreement with rec.  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   Follow Up Recommendations  Outpatient OT    Equipment Recommendations       Recommendations for Other Services      Precautions / Restrictions Precautions Precautions: Fall Restrictions Weight Bearing Restrictions: No       Mobility Bed Mobility                  Transfers                      Balance                                   ADL                                                Vision                     Perception     Praxis      Cognition   Behavior During Therapy: WFL for tasks assessed/performed Overall Cognitive Status: Within Functional Limits for tasks assessed                       Extremity/Trunk Assessment               Exercises     Shoulder Instructions       General Comments      Pertinent Vitals/ Pain       Pain Assessment: No/denies pain  Home Living Family/patient expects to be discharged to:: Private residence Living Arrangements: Spouse/significant other Available Help at  Discharge: Family Type of Home: House       Home Layout: Multi-level;Full bath on main level     Bathroom Shower/Tub: Teacher, early years/pre: Standard Bathroom Accessibility: No   Home Equipment: None          Prior Functioning/Environment Level of Independence: Independent        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   Frequency Min 1X/week     Progress Toward Goals  OT Goals(current goals can now be found in the care plan section)  Progress towards OT goals: Progressing toward goals  Acute Rehab OT Goals Patient Stated Goal: "to get back control of my R arm again" OT Goal Formulation: With patient/family Time For Goal Achievement: 07/21/16 Potential to Achieve Goals: Good  Plan      Co-evaluation                 End of Session     Activity Tolerance Patient tolerated treatment well   Patient Left in bed;with call bell/phone within reach;with family/visitor present   Nurse Communication          Time: 1400-1420 OT Time Calculation (min): 20 min  Charges: OT General Charges $OT Visit: 1 Procedure OT Treatments $Therapeutic Activity: 8-22 mins   Chrys Racer, OTR/L ascom [phone] 07/07/16, 3:11 PM

## 2016-07-07 NOTE — Consult Note (Signed)
CC: falling to the right side   HPI: Nathaniel Gonzalez is an 50 y.o. male felt a llittle lightheaded. Around 7 PM night prior he was falling over to the right. At 8:30 PM almost walked into a walk as he was falling over to the right. Pt was sent from out pt office. Occasionally complaining of numbness in right side.   Past Medical History:  Diagnosis Date  . Allergy   . Chicken pox   . Medical history non-contributory     Past Surgical History:  Procedure Laterality Date  . MOUTH SURGERY    . SKIN LESION EXCISION     several, he goes yearly    Family History  Problem Relation Age of Onset  . Skin cancer Mother   . Colon polyps Mother   . Cancer Mother     skin, ureter cancer  . Heart attack Father   . COPD Father   . Aneurysm Father   . Colon polyps Father   . Heart disease Father   . Hypertension Brother   . Cancer Maternal Aunt     skin  . Cancer Maternal Grandmother     unknown type  . Heart disease Maternal Grandmother   . Cancer Paternal Grandmother     liver  . Heart disease Paternal Grandfather     died age 48 MI  . Cancer Cousin     skin    Social History:  reports that he has never smoked. He has never used smokeless tobacco. He reports that he does not drink alcohol or use drugs.  Allergies  Allergen Reactions  . Erythromycin Hives    Medications: I have reviewed the patient's current medications.  ROS: History obtained from the patient  General ROS: negative for - chills, fatigue, fever, night sweats, weight gain or weight loss Psychological ROS: negative for - behavioral disorder, hallucinations, memory difficulties, mood swings or suicidal ideation Ophthalmic ROS: negative for - blurry vision, double vision, eye pain or loss of vision ENT ROS: negative for - epistaxis, nasal discharge, oral lesions, sore throat, tinnitus or vertigo Allergy and Immunology ROS: negative for - hives or itchy/watery eyes Hematological and Lymphatic ROS:  negative for - bleeding problems, bruising or swollen lymph nodes Endocrine ROS: negative for - galactorrhea, hair pattern changes, polydipsia/polyuria or temperature intolerance Respiratory ROS: negative for - cough, hemoptysis, shortness of breath or wheezing Cardiovascular ROS: negative for - chest pain, dyspnea on exertion, edema or irregular heartbeat Gastrointestinal ROS: negative for - abdominal pain, diarrhea, hematemesis, nausea/vomiting or stool incontinence Genito-Urinary ROS: negative for - dysuria, hematuria, incontinence or urinary frequency/urgency Musculoskeletal ROS: negative for - joint swelling or muscular weakness Neurological ROS: as noted in HPI Dermatological ROS: negative for rash and skin lesion changes  Physical Examination: Blood pressure (!) 145/89, pulse 76, temperature 98.7 F (37.1 C), temperature source Oral, resp. rate 20, height 5\' 10"  (1.778 m), weight 94 kg (207 lb 4.8 oz), SpO2 100 %.   Neurological Examination Mental Status: Alert, oriented, thought content appropriate.  Speech fluent without evidence of aphasia.  Able to follow 3 step commands without difficulty. Cranial Nerves: II: Discs flat bilaterally; Visual fields grossly normal, pupils equal, round, reactive to light and accommodation III,IV, VI: ptosis not present, extra-ocular motions intact bilaterally V,VII: smile symmetric, facial light touch sensation normal bilaterally VIII: hearing normal bilaterally IX,X: gag reflex present XI: bilateral shoulder shrug XII: midline tongue extension Motor: Right : Upper extremity   5/5    Left:  Upper extremity   5/5  Lower extremity   5/5     Lower extremity   5/5 Tone and bulk:normal tone throughout; no atrophy noted Sensory: decreased to light touch Deep Tendon Reflexes: 1+ and symmetric throughout Plantars: Right: downgoing   Left: downgoing Cerebellar: Dysmetria right side.  Gait: not tested       Laboratory Studies:   Basic  Metabolic Panel:  Recent Labs Lab 07/06/16 1138 07/07/16 0519  NA 137 141  K 3.6 3.8  CL 104 109  CO2 23 29  GLUCOSE 125* 114*  BUN 13 14  CREATININE 1.17 1.09  CALCIUM 9.4 9.0    Liver Function Tests: No results for input(s): AST, ALT, ALKPHOS, BILITOT, PROT, ALBUMIN in the last 168 hours. No results for input(s): LIPASE, AMYLASE in the last 168 hours. No results for input(s): AMMONIA in the last 168 hours.  CBC:  Recent Labs Lab 07/06/16 1138 07/07/16 0519  WBC 8.3 7.9  HGB 17.2 15.4  HCT 47.7 41.5  MCV 84.9 85.2  PLT 251 193    Cardiac Enzymes: No results for input(s): CKTOTAL, CKMB, CKMBINDEX, TROPONINI in the last 168 hours.  BNP: Invalid input(s): POCBNP  CBG: No results for input(s): GLUCAP in the last 168 hours.  Microbiology: No results found for this or any previous visit.  Coagulation Studies: No results for input(s): LABPROT, INR in the last 72 hours.  Urinalysis:  Recent Labs Lab 07/06/16 1439  COLORURINE YELLOW*  LABSPEC 1.014  PHURINE 6.0  GLUCOSEU NEGATIVE  HGBUR NEGATIVE  BILIRUBINUR NEGATIVE  KETONESUR NEGATIVE  PROTEINUR NEGATIVE  NITRITE NEGATIVE  LEUKOCYTESUR NEGATIVE    Lipid Panel:     Component Value Date/Time   CHOL 185 07/07/2016 0519   CHOL 213 (H) 06/09/2016 1102   TRIG 200 (H) 07/07/2016 0519   HDL 29 (L) 07/07/2016 0519   HDL 37 (L) 06/09/2016 1102   CHOLHDL 6.4 07/07/2016 0519   VLDL 40 07/07/2016 0519   LDLCALC 116 (H) 07/07/2016 0519   LDLCALC 127 (H) 06/09/2016 1102    HgbA1C:  Lab Results  Component Value Date   HGBA1C 4.8 07/06/2016    Urine Drug Screen:     Component Value Date/Time   LABOPIA NONE DETECTED 07/06/2016 1439   COCAINSCRNUR NONE DETECTED 07/06/2016 1439   LABBENZ NONE DETECTED 07/06/2016 1439   AMPHETMU NONE DETECTED 07/06/2016 1439   THCU NONE DETECTED 07/06/2016 1439   LABBARB NONE DETECTED 07/06/2016 1439    Alcohol Level: No results for input(s): ETH in the last 168  hours.  Other results: EKG: normal EKG, normal sinus rhythm, unchanged from previous tracings.  Imaging: Ct Head Wo Contrast  Result Date: 07/06/2016 CLINICAL DATA:  Dizziness beginning yesterday. Patient felt he was falling. EXAM: CT HEAD WITHOUT CONTRAST TECHNIQUE: Contiguous axial images were obtained from the base of the skull through the vertex without intravenous contrast. COMPARISON:  None. FINDINGS: Brain: No evidence of acute infarction, hemorrhage, extra-axial collection, ventriculomegaly, or mass effect. Normal for age brain volume. No white matter disease of significance. Vascular: No hyperdense vessel or unexpected calcification. Skull: Negative for fracture or focal lesion. Sinuses/Orbits: No acute findings. Other: None. IMPRESSION: Negative exam. Electronically Signed   By: Staci Righter M.D.   On: 07/06/2016 13:23   Mr Jeri Cos [id number] Contrast  Result Date: 07/07/2016 CLINICAL DATA:  Lightheaded. Falling to right. Lack of coordination. EXAM: MRI HEAD WITHOUT AND WITH CONTRAST TECHNIQUE: Multiplanar, multiecho pulse sequences of the brain and surrounding structures were  obtained without and with intravenous contrast. CONTRAST:  70mL MULTIHANCE GADOBENATE DIMEGLUMINE 529 MG/ML IV SOLN COMPARISON:  CT head 07/06/2016 FINDINGS: Acute infarct right PICA territory. Scattered small areas of acute infarct right inferior cerebellum including the cerebellar tonsil on the right. No other areas of acute infarct. No significant chronic ischemia. Ventricle size normal.  Cerebral volume normal. Brainstem and basal ganglia normal. Negative for intracranial hemorrhage.  Negative for mass or edema. Normal enhancement postcontrast administration. No enhancing mass lesion. Normal dural venous sinus enhancement. Normal flow void in both vertebral arteries and the basilar. Normal flow void in both internal carotid arteries. Pituitary normal in size. Minimal mucosal edema in the base the maxillary sinus bilaterally.  Remaining sinuses clear. No orbital lesion. Pituitary normal in size.  Calvarium intact. IMPRESSION: Acute infarct right PICA territory.  Otherwise negative. Electronically Signed   By: Franchot Gallo M.D.   On: 07/07/2016 12:54   Mr Cervical Spine Wo Contrast  Result Date: 07/07/2016 CLINICAL DATA:  Lightheaded.  Uncoordinated.  Right PICA stroke. EXAM: MRI CERVICAL SPINE WITHOUT CONTRAST TECHNIQUE: Multiplanar, multisequence MR imaging of the cervical spine was performed. No intravenous contrast was administered. COMPARISON:  MRI head today FINDINGS: Alignment: Normal alignment. Vertebrae: Negative for fracture or mass. Image quality degraded by patient size and scan technique. Cord: Cord signal normal. Posterior Fossa, vertebral arteries, paraspinal tissues: Right PICA infarct best seen on brain MRI done earlier today Disc levels: C2-3:  Negative C3-4: Mild disc degeneration with mild uncinate spurring. Mild foraminal narrowing bilaterally. C4-5: Mild disc degeneration and spurring. Mild spinal stenosis and mild foraminal stenosis bilaterally C5-6: Central disc and osteophyte complex with cord flattening and moderate spinal stenosis. Mild foraminal narrowing bilaterally. C6-7: Disc degeneration with diffuse uncinate spurring. Mild spinal stenosis and mild foraminal stenosis bilaterally. C7-T1:  Negative IMPRESSION: Mild spinal stenosis and mild foraminal narrowing bilaterally at C4-5. Mild foraminal narrowing bilaterally at C3-4 Central disc and osteophyte complex at C5-6 with moderate spinal stenosis and mild foraminal narrowing bilaterally Mild spinal stenosis and mild foraminal stenosis bilaterally due to spurring at C6-7. Electronically Signed   By: Franchot Gallo M.D.   On: 07/07/2016 13:02   US Carotid Bilateral  Result Date: 07/06/2016 CLINICAL DATA:  Acute right arm weakness, syncope EXAM: BILATERAL CAROTID DUPLEX ULTRASOUND TECHNIQUE: Pearline Cables scale imaging, color Doppler and duplex ultrasound were  performed of bilateral carotid and vertebral arteries in the neck. COMPARISON:  07/06/2016 head CT FINDINGS: Criteria: Quantification of carotid stenosis is based on velocity parameters that correlate the residual internal carotid diameter with NASCET-based stenosis levels, using the diameter of the distal internal carotid lumen as the denominator for stenosis measurement. The following velocity measurements were obtained: RIGHT ICA:  94/38 cm/sec CCA:  [id number] cm/sec SYSTOLIC ICA/CCA RATIO:  1.2 DIASTOLIC ICA/CCA RATIO:  2.2 ECA:  123 cm/sec LEFT ICA:  96/30 cm/sec CCA:  [id number] cm/sec SYSTOLIC ICA/CCA RATIO:  1.0 DIASTOLIC ICA/CCA RATIO:  1.6 ECA:  108 cm/sec RIGHT CAROTID ARTERY: Minor proximal ICA atherosclerosis and intimal thickening. No hemodynamically significant right ICA stenosis, velocity elevation, or turbulent flow. Degree of narrowing less than 50%. RIGHT VERTEBRAL ARTERY:  Antegrade LEFT CAROTID ARTERY: Similar scattered minor intimal thickening. No significant atherosclerotic plaque formation. No hemodynamically significant left ICA stenosis, velocity elevation, or turbulent flow. LEFT VERTEBRAL ARTERY:  Antegrade IMPRESSION: Minor carotid intimal thickening and early atherosclerosis. No significant ICA stenosis by ultrasound criteria. Degree of narrowing less than 50% bilaterally. Patent antegrade vertebral flow bilaterally Electronically Signed  By: Eugenie Filler M.D.   On: 07/06/2016 15:57     Assessment/Plan: 50 y.o. male felt a llittle lightheaded. Around 7 PM night prior he was falling over to the right. At 8:30 PM almost walked into a walk as he was falling over to the right. Pt was sent from out pt office. Occasionally complaining of numbness onn right arm.  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  Will follow      07/07/2016, 2:24 PM

## 2016-07-07 NOTE — Progress Notes (Signed)
[facility] [place] [facility] NAME: Nathaniel Gonzalez    MR#:  [id number]  DATE OF BIRTH:  July 23, 1966  SUBJECTIVE:  CHIEF COMPLAINT:   Chief Complaint  Patient presents with  . Dizziness  Still feeling somewhat dizzy, wife at bedside REVIEW OF SYSTEMS:  Review of Systems  Constitutional: Negative for chills, fever and weight loss.  HENT: Negative for nosebleeds and sore throat.   Eyes: Negative for blurred vision.  Respiratory: Negative for cough, shortness of breath and wheezing.   Cardiovascular: Negative for chest pain, orthopnea, leg swelling and PND.  Gastrointestinal: Negative for abdominal pain, constipation, diarrhea, heartburn, nausea and vomiting.  Genitourinary: Negative for dysuria and urgency.  Musculoskeletal: Negative for back pain.  Skin: Negative for rash.  Neurological: Positive for dizziness. Negative for speech change, focal weakness and headaches.  Endo/Heme/Allergies: Does not bruise/bleed easily.  Psychiatric/Behavioral: Negative for depression.   DRUG ALLERGIES:   Allergies  Allergen Reactions  . Erythromycin Hives   VITALS:  Blood pressure 128/70, pulse 81, temperature 98.6 F (37 C), temperature source Oral, resp. rate 18, height 5\' 10"  (1.778 m), weight 94 kg (207 lb 4.8 oz), SpO2 98 %. PHYSICAL EXAMINATION:  Physical Exam  Constitutional: He is oriented to person, place, and time and well-developed, well-nourished, and in no distress.  HENT:  Head: Normocephalic and atraumatic.  Eyes: Conjunctivae and EOM are normal. Pupils are equal, round, and reactive to light.  Neck: Normal range of motion. Neck supple. No tracheal deviation present. No thyromegaly present.  Cardiovascular: Normal rate, regular rhythm and normal heart sounds.   Pulmonary/Chest: Effort normal and breath sounds normal. No respiratory distress. He has no wheezes. He exhibits no tenderness.  Abdominal: Soft. Bowel sounds are normal. He  exhibits no distension. There is no tenderness.  Musculoskeletal: Normal range of motion.  Neurological: He is alert and oriented to person, place, and time. No cranial nerve deficit.  Skin: Skin is warm and dry. No rash noted.  Psychiatric: Mood and affect normal.   LABORATORY PANEL:   CBC  Recent Labs Lab 07/07/16 0519  WBC 7.9  HGB 15.4  HCT 41.5  PLT 193   ------------------------------------------------------------------------------------------------------------------ Chemistries   Recent Labs Lab 07/07/16 0519  NA 141  K 3.8  CL 109  CO2 29  GLUCOSE 114*  BUN 14  CREATININE 1.09  CALCIUM 9.0   RADIOLOGY:  Ct Head Wo Contrast  Result Date: 07/06/2016 CLINICAL DATA:  Dizziness beginning yesterday. Patient felt he was falling. EXAM: CT HEAD WITHOUT CONTRAST TECHNIQUE: Contiguous axial images were obtained from the base of the skull through the vertex without intravenous contrast. COMPARISON:  None. FINDINGS: Brain: No evidence of acute infarction, hemorrhage, extra-axial collection, ventriculomegaly, or mass effect. Normal for age brain volume. No white matter disease of significance. Vascular: No hyperdense vessel or unexpected calcification. Skull: Negative for fracture or focal lesion. Sinuses/Orbits: No acute findings. Other: None. IMPRESSION: Negative exam. Electronically Signed   By: Staci Righter M.D.   On: 07/06/2016 13:23   US Carotid Bilateral  Result Date: 07/06/2016 CLINICAL DATA:  Acute right arm weakness, syncope EXAM: BILATERAL CAROTID DUPLEX ULTRASOUND TECHNIQUE: Pearline Cables scale imaging, color Doppler and duplex ultrasound were performed of bilateral carotid and vertebral arteries in the neck. COMPARISON:  07/06/2016 head CT FINDINGS: Criteria: Quantification of carotid stenosis is based on velocity parameters that correlate the residual internal carotid diameter with NASCET-based stenosis levels, using the diameter of the distal internal carotid lumen as  the  denominator for stenosis measurement. The following velocity measurements were obtained: RIGHT ICA:  94/38 cm/sec CCA:  [id number] cm/sec SYSTOLIC ICA/CCA RATIO:  1.2 DIASTOLIC ICA/CCA RATIO:  2.2 ECA:  123 cm/sec LEFT ICA:  96/30 cm/sec CCA:  [id number] cm/sec SYSTOLIC ICA/CCA RATIO:  1.0 DIASTOLIC ICA/CCA RATIO:  1.6 ECA:  108 cm/sec RIGHT CAROTID ARTERY: Minor proximal ICA atherosclerosis and intimal thickening. No hemodynamically significant right ICA stenosis, velocity elevation, or turbulent flow. Degree of narrowing less than 50%. RIGHT VERTEBRAL ARTERY:  Antegrade LEFT CAROTID ARTERY: Similar scattered minor intimal thickening. No significant atherosclerotic plaque formation. No hemodynamically significant left ICA stenosis, velocity elevation, or turbulent flow. LEFT VERTEBRAL ARTERY:  Antegrade IMPRESSION: Minor carotid intimal thickening and early atherosclerosis. No significant ICA stenosis by ultrasound criteria. Degree of narrowing less than 50% bilaterally. Patent antegrade vertebral flow bilaterally Electronically Signed   By: Jerilynn Mages.  Shick M.D.   On: 07/06/2016 15:57   ASSESSMENT AND PLAN:  50 y.o. male Admitted for possible stroke rule out  1. Right arm incoordination, ataxia. Rule out cerebellar stroke.  Pending MRI of the brain, carotid ultrasound showed degree of narrowing less than 50% bilaterally and echocardiogram is pending.  - Continue Aspirin  - The patient does have right otitis media for which he is on Augmentin which could cause the ataxia. - The patient also does have neck discomfort and I will also do an MRI of the cervical spine while he is here. - Neurology consult and neuro checks - LDL is 116 -> will start statin  2. Elevated blood pressure without history of hypertension. Allow permissive hypertension at this point until I rule out stroke.  3. Impaired fasting glucose: hemoglobin A1c 4.1.  Diabetes is ruled out  4. Nausea vomiting: Transient and resolved. When necessary  Zofran.       All the records are reviewed and case discussed with Care Management/Social Worker. Management plans discussed with the patient, family and they are in agreement.  CODE STATUS: Full code  TOTAL TIME TAKING CARE OF THIS PATIENT: 25 minutes.   More than 50% of the time was spent in counseling/coordination of care: YES  POSSIBLE D/C IN 1 DAYS, DEPENDING ON CLINICAL CONDITION.  And neurological evaluation   [facility], Yavonne Kiss M.D on 07/07/2016 at 11:47 AM  Between 7am to 6pm - Pager - [phone]  After 6pm go to [email or web address] - password [email or web address] [facility]  [facility]  Office  [phone]  CC: Primary care physician; Wilhemena Durie, MD  Note: This dictation was prepared with Dragon dictation along with smaller phrase technology. Any transcriptional errors that result from this process are unintentional.

## 2016-07-07 NOTE — Evaluation (Signed)
Occupational Therapy Evaluation Patient Details Name: Nathaniel Gonzalez MRN: [id number] DOB: [birth year] Today's Date: 07/07/2016    History of Present Illness Nathaniel Gonzalez is a 50 y.o. male states that for the clock yesterday he felt a little lightheaded. Around 7 PM last night he felt weird like he was falling over to the right. At 8:30 PM almost walked into a walk as he was falling over to the right. He went to sleep around 5 AM he woke up with his right arm tingling. He went on for while on his right hand was still tingling. He went back to sleep and woke up around 8:30 AM in his right arm still felt like it was asleep. They called Dr. Alben Spittle office and they saw him today and referred into the ER for incoordination with his right hand with finger nose testing and with Romberg he fell over to the right. He did vomit here in the ER one time. Of note, he's been having a little neck pain and used a heating pad for the last 3 days. He also has a slight cough. CT was negative and MRI to be completed today.   Clinical Impression   Pt is 49 year old male who presents with light headedness and tingling in RUE and hand from his neck to face and fingers.  CT is negative and he has an ear infection in R ear, MRI to be completed today at 11:30am. 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 He presents with decreased fine motor control with dysmetria of R dominant hand with tingling in R forearm to fingers both sides but intact sensation and proprioception and mild increase in flexor tone.  He tends to overshoot with R hand with eyes open and closed and more when moving R UE and hand quickly.  He has a mild sway siting EOB with eyes closed and open but no LOB.  He is able to lean forward to reach feet for LB dressing skills with supervision but is not able to complete fine motor tasks such as buttons or tying shoes and requires mod assist to complete. He is at risk for falls  due to decreased balance. He is able to write his name but is about 75% legible compared to baseline and had difficulty positioning pen in hand initially. He is not able to cut food and using fork and spoon is slow and uncoordinated at times and drops utensils. Rec a foam handle for utensils and a pen gripper for writing with pen and for marker for use at work for smart board. No visual deficits noted.  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    Follow Up Recommendations  Outpatient OT    Equipment Recommendations       Recommendations for Other Services       Precautions / Restrictions Precautions Precautions: Fall Restrictions Weight Bearing Restrictions: No      Mobility Bed Mobility                  Transfers                      Balance  ADL Overall ADL's : Needs assistance/impaired                                       General ADL Comments: Pt presents with decreased fine motor control with dysmetria of R dominant hand with tingling in R forearm to fingers both sides but intact sensation and proprioception.  He tends to overshoot with R hand with eyes open and closed and more when moving R UE and hand quickly.  He has a mild sway siting EOB with eyes closed and open but no LOB.  He is able to lean forward to reach feet for LB dressing skills but is not able to complete fine motor tasks such as buttons or tying shoes.  He is able to write his name but is about 75% legible compared to baseline and had difficulty positioning pen in hand initially. He is not able to cut food and using fork and spoon is slow and uncoordinated at times and drops utensils. Rec a foam handle for utensils and a pen gripper for writing with pen and for  marker for use at work for smart board. No visual deficits noted.  He       Vision     Perception     Praxis      Pertinent Vitals/Pain Pain Assessment: No/denies pain     Hand Dominance Right   Extremity/Trunk Assessment Upper Extremity Assessment Upper Extremity Assessment: RUE deficits/detail RUE Deficits / Details: tingling in R forearm to fingers both sides, intact sensation, impaired fine motor control wtih dysmetria and decreased finger to nose on R; intact proprioception but overshoots to find R hand with eyes closed for light touch assessment RUE Coordination: decreased fine motor   Lower Extremity Assessment Lower Extremity Assessment: Defer to PT evaluation       Communication Communication Communication: No difficulties   Cognition Arousal/Alertness: Awake/alert Behavior During Therapy: WFL for tasks assessed/performed Overall Cognitive Status: Within Functional Limits for tasks assessed                     General Comments       Exercises       Shoulder Instructions      Home Living Family/patient expects to be discharged to:: Private residence Living Arrangements: Spouse/significant other Available Help at Discharge: Family Type of Home: House       Home Layout: One level     Bathroom Shower/Tub: Tub/shower unit Shower/tub characteristics: Architectural technologist: Standard Bathroom Accessibility: No   Home Equipment: None          Prior Functioning/Environment Level of Independence: Independent        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    OT Diagnosis: Generalized weakness;Hemiplegia dominant side (decreased fine motor control RUE and hand)   OT Problem List: Decreased coordination;Impaired UE functional use;Impaired balance (sitting and/or standing)   OT Treatment/Interventions: Self-care/ADL training;Patient/family education;Therapeutic activities;Neuromuscular education;DME  and/or AE instruction    OT Goals(Current goals can be found in the care plan section) Acute Rehab OT Goals Patient Stated Goal: "to get back control of my R arm again" OT Goal Formulation: With patient/family Time For Goal Achievement: 07/21/16 Potential to Achieve Goals: Good ADL Goals Pt Will Perform Eating: Independently;with adaptive utensils;sitting Pt Will Perform Grooming: Independently;with adaptive equipment;standing (using AD  for control) Pt Will Perform Upper  Body Dressing: Independently;sitting Pt Will Perform Lower Body Dressing: with supervision;sit to/from stand Pt Will Transfer to Toilet: Independently;regular height toilet (with no LOB) Pt/caregiver will Perform Home Exercise Program: Right Upper extremity;Independently;With written HEP provided (fine motor coordination )  OT Frequency: Min 1X/week   Barriers to D/C:            Co-evaluation              End of Session    Activity Tolerance: Patient tolerated treatment well Patient left: in bed;with call bell/phone within reach;with family/visitor present   Time: 0930-1030 OT Time Calculation (min): 60 min Charges:  OT General Charges $OT Visit: 1 Procedure OT Evaluation $OT Eval High Complexity: 1 Procedure OT Treatments $Self Care/Home Management : 8-22 mins $Therapeutic Activity: 8-22 mins $Neuromuscular Re-education: 8-22 mins G-Codes: OT G-codes **NOT FOR INPATIENT CLASS** Functional Limitation: Self care Self Care Current Status [id number]): At least 1 percent but less than 20 percent impaired, limited or restricted Self Care Goal Status [id number]): At least 20 percent but less than 40 percent impaired, limited or restricted   Chrys Racer, OTR/L ascom [phone] 07/07/16, 11:10 AM

## 2016-07-07 NOTE — Evaluation (Signed)
Physical Therapy Evaluation Patient Details Name: Nathaniel Gonzalez MRN: [id number] DOB: [birth year] Today's Date: 07/07/2016   History of Present Illness  Nathaniel Gonzalez is a 50 y.o. male states that for the clock yesterday he felt a little lightheaded. Around 7 PM last night he felt weird like he was falling over to the right. At 8:30 PM almost walked into a walk as he was falling over to the right. He went to sleep around 5 AM he woke up with his right arm tingling. He went on for while on his right hand was still tingling. He went back to sleep and woke up around 8:30 AM in his right arm still felt like it was asleep. They called Dr. Alben Spittle office and they saw him today and referred into the ER for incoordination with his right hand with finger nose testing and with Romberg he fell over to the right. He did vomit here in the ER one time. Of note, he's been having a little neck pain and used a heating pad for the last 3 days. He also has a slight cough. CT was negative and MRI to be completed today. Brain MRI performed and ruled in for cerebellar CVA.   Clinical Impression  Pt demonstrates good safety and stability with all mobility. Higher level balance deficits with Rhomberg testing and vertical head turns during ambulation. Primary deficits are with RUE coordination which is being followed by OT. Pt would benefit from OP PT for higher level balance deficits especially with eyes closed and on unstable surfaces. 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 Pt will also need OP PT for RUE deficits. 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 Pt will benefit from skilled PT services to address deficits in strength, balance, and mobility in order to return to full function at home.     Follow Up Recommendations Outpatient PT;Other (comment) (OP PT for high level balancebalance)    Equipment Recommendations  None recommended by  PT    Recommendations for Other Services OT consult     Precautions / Restrictions Precautions Precautions: Fall Restrictions Weight Bearing Restrictions: No      Mobility  Bed Mobility Overal bed mobility: Independent             General bed mobility comments: Good speed, sequencing, and stability noted.   Transfers Overall transfer level: Independent Equipment used: None             General transfer comment: Good speed, sequencing, and stability noted.   Ambulation/Gait Ambulation/Gait assistance: Supervision Ambulation Distance (Feet): 400 Feet Assistive device: None Gait Pattern/deviations: WFL(Within Functional Limits) Gait velocity: WFL Gait velocity interpretation: at or above normal speed for age/gender General Gait Details: Pt demonstrates ability to safely and confidently complete 2 laps around RN station. No deficits with horizontal head turns but pt demonstrates mild slowing and staggering with vertical head turns. Able to perform gait speed changes and 180 degree turns without instabilty. 11/12 modified DGI. No DOE and vitals remain WNL throughout ambulation  Stairs            Wheelchair Mobility    Modified Rankin (Stroke Patients Only)       Balance Overall balance assessment: Needs assistance Sitting-balance support: No upper extremity supported Sitting balance-Leahy Scale: Normal     Standing balance support: No upper extremity supported Standing balance-Leahy Scale: Good Standing balance comment: >10 seconds single leg balance bilaterally after 1 practice  attempt. Positive Rhomberg for increased sway, specifically to the R. However pt does not experience complete LOB as he is able to self correct for sway. 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     Hand Dominance   Dominant Hand: Right    Extremity/Trunk Assessment   Upper Extremity Assessment: RUE deficits/detail;Defer to OT evaluation RUE Deficits / Details: Decreased sensation to light touch as well as tingling distal to the elbow on the R side. Finger/thumb opposition intact as well as rapid alternating movements. Positive finger to nose on RUE. Negative pronator drift         Lower Extremity Assessment: Overall WFL for tasks assessed;RLE deficits/detail RLE Deficits / Details: Heel to shin WNL bilaterally. No focal deficits in strength/sensation noted in bilateral LE. Strength grossly 5/5 throughout       Communication   Communication: No difficulties  Cognition Arousal/Alertness: Awake/alert Behavior During Therapy: WFL for tasks assessed/performed Overall Cognitive Status: Within Functional Limits for tasks assessed                      General Comments General comments (skin integrity, edema, etc.): Pt appears to have mild L horizontal nystagmus but difficult to observe without Frenzel lenses    Exercises        Assessment/Plan    PT Assessment Patient needs continued PT services  PT Diagnosis Abnormality of gait   PT Problem List Decreased balance;Decreased coordination  PT Treatment Interventions Balance training;Neuromuscular re-education;Patient/family education   PT Goals (Current goals can be found in the Care Plan section) Acute Rehab PT Goals Patient Stated Goal: "to get back control of my R arm again" PT Goal Formulation: With patient/family Time For Goal Achievement: 07/21/16 Potential to Achieve Goals: Good    Frequency  7X/week   Barriers to discharge        Co-evaluation               End of Session Equipment Utilized During Treatment: Gait belt Activity Tolerance: Patient tolerated treatment well Patient left: in bed;with call bell/phone within reach;with family/visitor present           Time: [id number] PT Time Calculation (min) (ACUTE ONLY): 27 min   Charges:   PT Evaluation $PT Eval Moderate Complexity: 1 Procedure PT Treatments $Neuromuscular Re-education: 8-22 mins   PT G Codes:       Lyndel Safe Hue Steveson PT, DPT   Saadiya Wilfong 07/07/2016, 3:50 PM

## 2016-07-08 ENCOUNTER — Telehealth: Payer: Self-pay | Admitting: Family Medicine

## 2016-07-08 LAB — BASIC METABOLIC PANEL
Anion gap: 7 (ref 5–15)
BUN: 12 mg/dL (ref 6–20)
CO2: 27 mmol/L (ref 22–32)
CREATININE: 0.98 mg/dL (ref 0.61–1.24)
Calcium: 8.8 mg/dL — ABNORMAL LOW (ref 8.9–10.3)
Chloride: 107 mmol/L (ref 101–111)
GFR calc Af Amer: >60 mL/min (ref >60)
GLUCOSE: 102 mg/dL — AB (ref 65–99)
POTASSIUM: 3.8 mmol/L (ref 3.5–5.1)
SODIUM: 141 mmol/L (ref 135–145)

## 2016-07-08 LAB — CBC
HCT: 42.9 % (ref 40.0–52.0)
Hemoglobin: 15.6 g/dL (ref 13.0–18.0)
MCH: 31.4 pg (ref 26.0–34.0)
MCHC: 36.4 g/dL — AB (ref 32.0–36.0)
MCV: 86.4 fL (ref 80.0–100.0)
PLATELETS: 203 10*3/uL (ref 150–440)
RBC: 4.97 MIL/uL (ref 4.40–5.90)
RDW: 12.5 % (ref 11.5–14.5)
WBC: 8.2 10*3/uL (ref 3.8–10.6)

## 2016-07-08 MED ORDER — AMOXICILLIN-POT CLAVULANATE 875-125 MG PO TABS: 1.0000 | ORAL_TABLET | Freq: Two times a day (BID) | ORAL | 0 refills | Status: DC

## 2016-07-08 MED ORDER — ASPIRIN 325 MG PO TBEC: 325.0000 mg | DELAYED_RELEASE_TABLET | Freq: Every day | ORAL | 0 refills | Status: DC

## 2016-07-08 MED ORDER — ONDANSETRON 4 MG PO TBDP: 4.0000 mg | ORAL_TABLET | Freq: Three times a day (TID) | ORAL | 0 refills | Status: DC | PRN

## 2016-07-08 MED ORDER — ATORVASTATIN CALCIUM 40 MG PO TABS: 40.0000 mg | ORAL_TABLET | Freq: Every day | ORAL | 0 refills | Status: DC

## 2016-07-08 NOTE — Discharge Instructions (Addendum)
Stroke Prevention °Some health problems and behaviors may make it more likely for you to have a stroke. Below are ways to lessen your risk of having a stroke.  °· Be active for at least 30 minutes on most or all days. °· Do not smoke. Try not to be around others who smoke. °· Do not drink too much alcohol. °¨ Do not have more than 2 drinks a day if you are a man. °¨ Do not have more than 1 drink a day if you are a woman and are not pregnant. °· Eat healthy foods, such as fruits and vegetables. If you were put on a specific diet, follow the diet as told. °· Keep your cholesterol levels under control through diet and medicines. Look for foods that are low in saturated fat, trans fat, cholesterol, and are high in fiber. °· If you have diabetes, follow all diet plans and take your medicine as told. °· Ask your doctor if you need treatment to lower your blood pressure. If you have high blood pressure (hypertension), follow all diet plans and take your medicine as told by your doctor. °· If you are 18-39 years old, have your blood pressure checked every 3-5 years. If you are age 40 or older, have your blood pressure checked every year. °· Keep a healthy weight. Eat foods that are low in calories, salt, saturated fat, trans fat, and cholesterol. °· Do not take drugs. °· Avoid birth control pills, if this applies. Talk to your doctor about the risks of taking birth control pills. °· Talk to your doctor if you have sleep problems (sleep apnea). °· Take all medicine as told by your doctor. °¨ You may be told to take aspirin or blood thinner medicine. Take this medicine as told by your doctor. °¨ Understand your medicine instructions. °· Make sure any other conditions you have are being taken care of. °GET HELP RIGHT AWAY IF: °· You suddenly lose feeling (you feel numb) or have weakness in your face, arm, or leg. °· Your face or eyelid hangs down to one side. °· You suddenly feel confused. °· You have trouble talking (aphasia)  or understanding what people are saying. °· You suddenly have trouble seeing in one or both eyes. °· You suddenly have trouble walking. °· You are dizzy. °· You lose your balance or your movements are clumsy (uncoordinated). °· You suddenly have a very bad headache and you do not know the cause. °· You have new chest pain. °· Your heart feels like it is fluttering or skipping a beat (irregular heartbeat). °Do not wait to see if the symptoms above go away. Get help right away. Call your local emergency services (911 in [place] Do not drive yourself to the hospital. °  °This information is not intended to replace advice given to you by your health care provider. Make sure you discuss any questions you have with your health care provider. °  °Document Released: 05/22/2012 Document Revised: 12/12/2014 Document Reviewed: 05/24/2013 °Elsevier Interactive Patient Education ©2016 [employer] ° °

## 2016-07-08 NOTE — Care Management (Signed)
Discharge to home today per Dr. Manuella Ghazi. Received telephone call from [facility] representative. States she will be following up on Nathaniel Gonzalez post discharge, Family/friend will transport. Shelbie Ammons RN MSN CCM Care Management [phone]

## 2016-07-08 NOTE — Telephone Encounter (Signed)
Pt is being discharged from [facility] today for CVA.  I have scheduled a hospital follow up appointment/MW

## 2016-07-08 NOTE — Progress Notes (Signed)
MD making rounds. Received order to discharge home. IV removed. Provided with Education Handouts. Prescriptions E-Prescribed to pharmacy. No unanswered questions.Discharged via wheelchair by Engineer, manufacturing.

## 2016-07-10 NOTE — Discharge Summary (Signed)
[facility] NAME: Nathaniel Gonzalez    MR#:  [id number]  DATE OF BIRTH:  [birth year]  DATE OF ADMISSION:  07/06/2016   ADMITTING PHYSICIAN: Loletha Grayer, MD  DATE OF DISCHARGE: 07/08/2016 12:05 PM  PRIMARY CARE PHYSICIAN: Wilhemena Durie, MD   ADMISSION DIAGNOSIS:  Dizziness [R42] Right arm weakness [R29.898] Cerebral infarction due to unspecified mechanism [I63.9] DISCHARGE DIAGNOSIS:  Active Problems:   Right arm weakness   CVA (cerebral infarction)  SECONDARY DIAGNOSIS:   Past Medical History:  Diagnosis Date  . Allergy   . Chicken pox   . Medical history non-contributory    HOSPITAL COURSE:  50 y.o.maleAdmitted for possible stroke rule out  1. Right arm incoordination, ataxia - R cerebellar stroke confirmed on MRI which explains him falling to R side - Continue Aspirin  - LDL is 116 -> started on statin - No residual deficit  DISCHARGE CONDITIONS:  STABLE CONSULTS OBTAINED:  Treatment Team:  Leotis Pain, MD DRUG ALLERGIES:   Allergies  Allergen Reactions  . Erythromycin Hives   DISCHARGE MEDICATIONS:     Medication List    TAKE these medications   amoxicillin-clavulanate 875-125 MG tablet Commonly known as:  AUGMENTIN Take 1 tablet by mouth 2 (two) times daily.   aspirin 325 MG EC tablet Take 1 tablet (325 mg total) by mouth daily.   atorvastatin 40 MG tablet Commonly known as:  LIPITOR Take 1 tablet (40 mg total) by mouth daily at 6 PM.   fluticasone 50 MCG/ACT nasal spray Commonly known as:  FLONASE Place 2 sprays into both nostrils daily as needed. Reported on 06/09/2016   ondansetron 4 MG disintegrating tablet Commonly known as:  ZOFRAN-ODT Take 1 tablet (4 mg total) by mouth every 8 (eight) hours as needed for nausea or vomiting.        DISCHARGE INSTRUCTIONS:   DIET:  Regular diet DISCHARGE CONDITION:  Good ACTIVITY:  Activity as tolerated OXYGEN:  Home  Oxygen: No.  Oxygen Delivery: room air DISCHARGE LOCATION:  home   If you experience worsening of your admission symptoms, develop shortness of breath, life threatening emergency, suicidal or homicidal thoughts you must seek medical attention immediately by calling 911 or calling your MD immediately  if symptoms less severe.  You Must read complete instructions/literature along with all the possible adverse reactions/side effects for all the Medicines you take and that have been prescribed to you. Take any new Medicines after you have completely understood and accpet all the possible adverse reactions/side effects.   Please note  You were cared for by a hospitalist during your hospital stay. If you have any questions about your discharge medications or the care you received while you were in the hospital after you are discharged, you can call the unit and asked to speak with the hospitalist on call if the hospitalist that took care of you is not available. Once you are discharged, your primary care physician will handle any further medical issues. Please note that NO REFILLS for any discharge medications will be authorized once you are discharged, as it is imperative that you return to your primary care physician (or establish a relationship with a primary care physician if you do not have one) for your aftercare needs so that they can reassess your need for medications and monitor your lab values.    On the day of Discharge:  VITAL SIGNS:  Blood pressure 126/61, pulse 85, temperature 98.7 F (  37.1 C), temperature source Oral, resp. rate 18, height 5\' 10"  (1.778 m), weight 94 kg (207 lb 4.8 oz), SpO2 98 %. PHYSICAL EXAMINATION:  GENERAL:  50 y.o.-year-old patient lying in the bed with no acute distress.  EYES: Pupils equal, round, reactive to light and accommodation. No scleral icterus. Extraocular muscles intact.  HEENT: Head atraumatic, normocephalic. Oropharynx and nasopharynx clear.  NECK:   Supple, no jugular venous distention. No thyroid enlargement, no tenderness.  LUNGS: Normal breath sounds bilaterally, no wheezing, rales,rhonchi or crepitation. No use of accessory muscles of respiration.  CARDIOVASCULAR: S1, S2 normal. No murmurs, rubs, or gallops.  ABDOMEN: Soft, non-tender, non-distended. Bowel sounds present. No organomegaly or mass.  EXTREMITIES: No pedal edema, cyanosis, or clubbing.  NEUROLOGIC: Cranial nerves II through XII are intact. Muscle strength 5/5 in all extremities. Sensation intact. Gait not checked.  PSYCHIATRIC: The patient is alert and oriented x 3.  SKIN: No obvious rash, lesion, or ulcer.  DATA REVIEW:   CBC  Recent Labs Lab 07/08/16 0531  WBC 8.2  HGB 15.6  HCT 42.9  PLT 203    Chemistries   Recent Labs Lab 07/08/16 0531  NA 141  K 3.8  CL 107  CO2 27  GLUCOSE 102*  BUN 12  CREATININE 0.98  CALCIUM 8.8*    Follow-up Information    Wilhemena Durie, MD. Go on 07/14/2016.   Specialty:  Family Medicine Why:  @ 3:45 PM Contact information: [address] [place] [address] [phone]        Vladimir Crofts, MD. Go on 07/22/2016.   Specialty:  Neurology Why:  @ 3:30 PM  [place] information: [facility] [place] [address] [phone]            Management plans discussed with the patient, family and they are in agreement.  CODE STATUS: FULL CODE  TOTAL TIME TAKING CARE OF THIS PATIENT: 45 minutes.    [facility], Isadora Delorey M.D on 07/10/2016 at 10:02 PM  Between 7am to 6pm - Pager - [phone]  After 6pm go to [email or web address] - password [email or web address] [facility]  [facility]  Office  [phone]  CC: Primary care physician; Wilhemena Durie, MD   Note: This dictation was prepared with Dragon dictation along with smaller phrase technology. Any transcriptional errors that result from this process are  unintentional.

## 2016-07-14 ENCOUNTER — Ambulatory Visit (INDEPENDENT_AMBULATORY_CARE_PROVIDER_SITE_OTHER): Payer: BC Managed Care – PPO | Admitting: Family Medicine

## 2016-07-14 ENCOUNTER — Encounter: Payer: Self-pay | Admitting: Family Medicine

## 2016-07-14 VITALS — BP 126/74 | HR 88 | Temp 98.7°F | Resp 16 | Wt 211.0 lb

## 2016-07-14 DIAGNOSIS — M4802 Spinal stenosis, cervical region: Secondary | ICD-10-CM | POA: Diagnosis not present

## 2016-07-14 DIAGNOSIS — I639 Cerebral infarction, unspecified: Secondary | ICD-10-CM

## 2016-07-14 MED ORDER — ATORVASTATIN CALCIUM 40 MG PO TABS: 40.0000 mg | ORAL_TABLET | Freq: Every day | ORAL | 12 refills | Status: DC

## 2016-07-14 NOTE — Progress Notes (Signed)
       Patient: Nathaniel Gonzalez Male    DOB: 1966/06/18   50 y.o.   MRN: [id number] Visit Date: 07/14/2016  Today's Provider: Wilhemena Durie, MD   Chief Complaint  Patient presents with  . Hospitalization Follow-up   Subjective:    HPI Patient comes in today for a follow up after being hospitalized with a CVA. Patient reports that he was discharged from [facility] on 07/08/2016. Patient reports that he was also diagnosed with a middle ear infection. He was started on Augmentin and Lipitor 40mg . Patient has completed his abx, and reports that he has been tolerating the Lipitor well. Patient also mentions that he takes a daily aspirin 325mg  as well.     Allergies  Allergen Reactions  . Erythromycin Hives   No outpatient prescriptions have been marked as taking for the 07/14/16 encounter (Office Visit) with Jerrol Banana., MD.    Review of Systems  Constitutional: Positive for fatigue. Negative for activity change, appetite change, chills, diaphoresis, fever and unexpected weight change.  Respiratory: Negative for apnea, cough, choking, chest tightness, shortness of breath, wheezing and stridor.   Cardiovascular: Negative for chest pain, palpitations and leg swelling.  Neurological:       Still feels "a little off" but symptoms are continuing to slowly improve.     Social History  Substance Use Topics  . Smoking status: Never Smoker  . Smokeless tobacco: Never Used  . Alcohol use No   Objective:   BP 126/74 (BP Location: Right Arm, Patient Position: Sitting, Cuff Size: Normal)   Pulse 88   Temp 98.7 F (37.1 C)   Resp 16   Wt 211 lb (95.7 kg)   BMI 30.28 kg/m   Physical Exam  Constitutional: He is oriented to person, place, and time. He appears well-developed and well-nourished.  HENT:  Head: Normocephalic and atraumatic.  Right Ear: External ear normal.  Left Ear: External ear normal.  Nose: Nose normal.  Mouth/Throat: Oropharynx is clear and moist.    Cardiovascular: Normal rate, regular rhythm, normal heart sounds and intact distal pulses.   Pulmonary/Chest: Effort normal and breath sounds normal.  Abdominal: Soft.  Neurological: He is alert and oriented to person, place, and time. No cranial nerve deficit. He exhibits normal muscle tone. Coordination normal.  Normal exam today. Patient does complain of tingling in the right hand has a normal sensory exam.  Skin: Skin is warm and dry.  Psychiatric: He has a normal mood and affect. His behavior is normal. Judgment and thought content normal.        Assessment & Plan:     1. Cerebral infarction due to unspecified mechanism  - Ambulatory referral to Physical Therapy - Ambulatory referral to Occupational Therapy 2. Cerebellar stroke/embolic Patient had a complete workup in the hospital. Will see Dr. Manuella Ghazi. for follow-up. Continue full dose aspirin indefinitely. Consider cardiology evaluation. Patient now on statin and aspirin. Sees Dr. Manuella Ghazi in 8 days. Have told him not to climb any ladders. We'll wait for Dr. Ocie Cornfield to clear him for driving. Follow-up 2 months.  More than 50% of this visit is spent in counseling. 3. Cervical disc disease/cervical spinal stenosis       Wilhemena Durie, MD  [facility]

## 2016-07-25 ENCOUNTER — Encounter: Admission: RE | Payer: Self-pay | Source: Ambulatory Visit

## 2016-07-25 ENCOUNTER — Ambulatory Visit: Admission: RE | Admit: 2016-07-25 | Payer: Self-pay | Source: Ambulatory Visit | Admitting: Gastroenterology

## 2016-07-25 DIAGNOSIS — R002 Palpitations: Secondary | ICD-10-CM | POA: Insufficient documentation

## 2016-07-25 SURGERY — COLONOSCOPY WITH PROPOFOL
Anesthesia: General

## 2016-08-17 ENCOUNTER — Encounter
Admission: RE | Disposition: A | Discharge status: Home / Self Care | Payer: Self-pay | Source: Ambulatory Visit | Attending: Internal Medicine

## 2016-08-17 ENCOUNTER — Ambulatory Visit
Admission: RE | Admit: 2016-08-17 | Discharge: 2016-08-17 | Disposition: A | Discharge status: Home / Self Care | Payer: BC Managed Care – PPO | Source: Ambulatory Visit | Attending: Internal Medicine | Admitting: Internal Medicine

## 2016-08-17 DIAGNOSIS — Z881 Allergy status to other antibiotic agents status: Secondary | ICD-10-CM | POA: Insufficient documentation

## 2016-08-17 DIAGNOSIS — Z7902 Long term (current) use of antithrombotics/antiplatelets: Secondary | ICD-10-CM | POA: Diagnosis not present

## 2016-08-17 DIAGNOSIS — Z7982 Long term (current) use of aspirin: Secondary | ICD-10-CM | POA: Insufficient documentation

## 2016-08-17 DIAGNOSIS — Z809 Family history of malignant neoplasm, unspecified: Secondary | ICD-10-CM | POA: Diagnosis not present

## 2016-08-17 DIAGNOSIS — Z8249 Family history of ischemic heart disease and other diseases of the circulatory system: Secondary | ICD-10-CM | POA: Insufficient documentation

## 2016-08-17 DIAGNOSIS — I63541 Cerebral infarction due to unspecified occlusion or stenosis of right cerebellar artery: Secondary | ICD-10-CM | POA: Diagnosis not present

## 2016-08-17 DIAGNOSIS — I739 Peripheral vascular disease, unspecified: Secondary | ICD-10-CM | POA: Insufficient documentation

## 2016-08-17 DIAGNOSIS — Z79899 Other long term (current) drug therapy: Secondary | ICD-10-CM | POA: Insufficient documentation

## 2016-08-17 DIAGNOSIS — Z841 Family history of disorders of kidney and ureter: Secondary | ICD-10-CM | POA: Insufficient documentation

## 2016-08-17 HISTORY — PX: TEE WITHOUT CARDIOVERSION: SHX5443

## 2016-08-17 SURGERY — ECHOCARDIOGRAM, TRANSESOPHAGEAL
Anesthesia: Moderate Sedation

## 2016-08-17 MED ORDER — MIDAZOLAM HCL 5 MG/5ML IJ SOLN
INTRAMUSCULAR | Status: AC
  Filled 2016-08-17: qty 10

## 2016-08-17 MED ORDER — BUTAMBEN-TETRACAINE-BENZOCAINE 2-2-14 % EX AERO
INHALATION_SPRAY | CUTANEOUS | Status: AC
  Filled 2016-08-17: qty 20

## 2016-08-17 MED ORDER — FENTANYL CITRATE (PF) 100 MCG/2ML IJ SOLN
INTRAMUSCULAR | Status: AC
  Filled 2016-08-17: qty 4

## 2016-08-17 MED ORDER — LIDOCAINE VISCOUS 2 % MT SOLN
OROMUCOSAL | Status: AC
  Filled 2016-08-17: qty 15

## 2016-08-17 MED ORDER — FENTANYL CITRATE (PF) 100 MCG/2ML IJ SOLN
INTRAMUSCULAR | Status: AC | PRN
  Administered 2016-08-17 (×2): 50 ug via INTRAVENOUS

## 2016-08-17 MED ORDER — SODIUM CHLORIDE 0.9 % IV SOLN: INTRAVENOUS | Status: DC

## 2016-08-17 MED ORDER — MIDAZOLAM HCL 5 MG/5ML IJ SOLN
INTRAMUSCULAR | Status: AC | PRN
  Administered 2016-08-17: 2 mg via INTRAVENOUS
  Administered 2016-08-17 (×3): 1 mg via INTRAVENOUS

## 2016-08-17 NOTE — Progress Notes (Signed)
Procedure completed. Patient tolerated well. Wife at bedside. Will continue to monitor. Patient asleep but easily aroused to voice.

## 2016-08-17 NOTE — Progress Notes (Signed)
*  PRELIMINARY RESULTS* Echocardiogram Echocardiogram Transesophageal has been performed.  Nathaniel Gonzalez 08/17/2016, 9:02 AM

## 2016-08-18 ENCOUNTER — Encounter: Payer: Self-pay | Admitting: Internal Medicine

## 2016-08-22 ENCOUNTER — Ambulatory Visit: Payer: BC Managed Care – PPO | Admitting: Hematology and Oncology

## 2016-09-05 ENCOUNTER — Inpatient Hospital Stay: Payer: BC Managed Care – PPO | Admitting: Hematology and Oncology

## 2016-09-16 ENCOUNTER — Inpatient Hospital Stay: Payer: BC Managed Care – PPO | Attending: Hematology and Oncology | Admitting: Hematology and Oncology

## 2016-09-16 ENCOUNTER — Encounter: Payer: Self-pay | Admitting: Hematology and Oncology

## 2016-09-16 ENCOUNTER — Encounter (INDEPENDENT_AMBULATORY_CARE_PROVIDER_SITE_OTHER): Payer: Self-pay

## 2016-09-16 VITALS — BP 132/90 | HR 71 | Temp 96.2°F | Ht 70.0 in | Wt 212.7 lb

## 2016-09-16 DIAGNOSIS — R200 Anesthesia of skin: Secondary | ICD-10-CM | POA: Insufficient documentation

## 2016-09-16 DIAGNOSIS — Z7982 Long term (current) use of aspirin: Secondary | ICD-10-CM | POA: Diagnosis not present

## 2016-09-16 DIAGNOSIS — I69898 Other sequelae of other cerebrovascular disease: Secondary | ICD-10-CM | POA: Insufficient documentation

## 2016-09-16 DIAGNOSIS — Z8673 Personal history of transient ischemic attack (TIA), and cerebral infarction without residual deficits: Secondary | ICD-10-CM | POA: Diagnosis not present

## 2016-09-16 DIAGNOSIS — R5383 Other fatigue: Secondary | ICD-10-CM | POA: Insufficient documentation

## 2016-09-16 DIAGNOSIS — I639 Cerebral infarction, unspecified: Secondary | ICD-10-CM

## 2016-09-16 DIAGNOSIS — R51 Headache: Secondary | ICD-10-CM | POA: Insufficient documentation

## 2016-09-16 DIAGNOSIS — Z79899 Other long term (current) drug therapy: Secondary | ICD-10-CM | POA: Insufficient documentation

## 2016-09-16 NOTE — Progress Notes (Signed)
Patient here today as new evaluation regarding protein C deficiency.  Referred by dr. Manuella Ghazi.  Patient had a stroke July 05, 2016. (Lower cerebellum - ischemic.  BP elevated today.

## 2016-09-16 NOTE — Progress Notes (Signed)
[facility] day:  09/16/2016  Chief Complaint: Nathaniel Gonzalez is a 50 y.o. male with recent history of a right cerebellar CVA who is referred by Dr. Jennings Books for protein C deficiency.  HPI: The patient was admitted to [facility] on 07/06/2016- - 07/08/2016 with an acute stroke. He denies any prior history of thrombosis or family history of thrombosis.  He states that he woke up on 07/05/2016 lightheaded and off balance.  He thought that his symptoms were related to his allergies. He was walking into the wall. He then noted that his right hand was "asleep".  Symptoms progressed to numbness in his right arm.  He presented to the emergency room.  MRI of the head with and without contrast on 07/06/2016 revealed acute infarct in the right PICA territory. There were scattered small areas of acute infarct in the right inferior cerebellum including the right cerebellar tonsil on the right  Carotid ultrasound on 07/06/2016 revealed minor carotid intimal thickening and early atherosclerosis. There is no significant internal carotid artery stenosis. The degree of narrowing was less than 50% bilaterally.  Echocardiogram on 07/06/2016 was unremarkable.  CT angiogram of the head with and without contrast on 07/07/2016 revealed no anterior circulation abnormality.  He was discharged on aspirin and atorvastatin.  He states that he has been subsequently seen by cardiology and neurology. He had a 48-hour Holter which was negative. He is currently undergoing a 30 day Holter.  Transesophageal echocardiogram (TEE) on 08/17/2016 revealed an ejection fraction of 65%.  There was no evidence of vegetation on any valve.  There was no evidence of thrombus in the atrial cavity or appendage.  There was no defect or patent foramen ovale.  There was no right to left shunt.  A hypercoagulable workup was sent on 07/26/2016.  CBC revealed a hematocrit of 41.4, hemoglobin 14.9, MCV  84.5, platelets 257,000, WBC 6100 with a normal differential.  Lupus anticoagulant panel was negative.  Anticardiolipin antibodies and beta-2 glycoprotein were negative.  Methyltetrahydrofolate reductase (MTHFR) was negative.  Homocystine was 9.2% (negative). AT-III antigen was 110% and AT III functional 104%. Protein S activity 61% (63 - 140%).  Protein C activity was 167%.  Symptomatically, he has gained weight as he is less active. His right arm has improved. He still feels that his right hand feel swollen/numb. He gets tired quickly. He has some sinus headaches.  He denies any history of hypertension.  He does not smoke or drink alcohol.  He denies any drug use.  He has no history of atrial fibrillation.   Past Medical History:  Diagnosis Date  . Allergy   . Chicken pox   . Medical history non-contributory   . Stroke [facility])     Past Surgical History:  Procedure Laterality Date  . COLONOSCOPY  2011  . MOUTH SURGERY    . SKIN LESION EXCISION     several, he goes yearly  . TEE WITHOUT CARDIOVERSION N/A 08/17/2016   Procedure: TRANSESOPHAGEAL ECHOCARDIOGRAM (TEE);  Surgeon: Corey Skains, MD;  Location: [facility] ORS;  Service: Cardiovascular;  Laterality: N/A;    Family History  Problem Relation Age of Onset  . Skin cancer Mother   . Colon polyps Mother   . Cancer Mother     skin, ureter cancer  . Heart attack Father   . COPD Father   . Aneurysm Father   . Colon polyps Father   . Heart disease Father   .  Hypertension Brother   . Cancer Maternal Aunt     skin  . Cancer Maternal Grandmother     unknown type  . Heart disease Maternal Grandmother   . Cancer Paternal Grandmother     liver  . Heart disease Paternal Grandfather     died age 57 MI  . Cancer Cousin     skin    Social History:  reports that he has never smoked. He has never used smokeless tobacco. He reports that he does not drink alcohol or use drugs.  He denies any drug use.  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  The patient is alone today.  Allergies:  Allergies  Allergen Reactions  . Erythromycin Hives    Current Medications: Current Outpatient Prescriptions  Medication Sig Dispense Refill  . aspirin EC 325 MG EC tablet Take 1 tablet (325 mg total) by mouth daily. 30 tablet 0  . atorvastatin (LIPITOR) 40 MG tablet Take 1 tablet (40 mg total) by mouth daily at 6 PM. 30 tablet 12  . fluticasone (FLONASE) 50 MCG/ACT nasal spray Place 2 sprays into both nostrils daily as needed. Reported on 06/09/2016    . Cholecalciferol (VITAMIN D-1000 MAX ST) 1000 units tablet Take by mouth.    . ondansetron (ZOFRAN-ODT) 4 MG disintegrating tablet Take 1 tablet (4 mg total) by mouth every 8 (eight) hours as needed for nausea or vomiting. (Patient not taking: Reported on 09/16/2016) 20 tablet 0   No current facility-administered medications for this visit.     Review of Systems:  GENERAL:  Feels good.  Tires quickly.  No fevers, sweats or weight loss. PERFORMANCE STATUS (ECOG):  0 HEENT:  Runny nose.  No visual changes, sore throat, mouth sores or tenderness. Lungs: No shortness of breath or cough.  No hemoptysis. Cardiac:  No chest pain, palpitations, orthopnea, or PND. GI:  No nausea, vomiting, diarrhea, constipation, melena or hematochezia. GU:  No urgency, frequency, dysuria, or hematuria. Musculoskeletal:  No back pain.  No joint pain.  No muscle tenderness. Extremities:  No pain or swelling. Skin:  No rashes or skin changes. Neuro:  Sinus headache.  Right hand numbness.  No weakness, balance or coordination issues. Endocrine:  No diabetes, thyroid issues, hot flashes or night sweats. Psych:  No mood changes, depression or anxiety. Pain:  No focal pain. Review of systems:  All other systems reviewed and found to be negative.  Physical Exam: Blood pressure 132/90, pulse 71, temperature (!) 96.2 F (35.7 C), temperature source Tympanic, height 5' 10"  (1.778 m), weight 212 lb 11.9 oz (96.5 kg). GENERAL:  Well developed, well nourished, sitting comfortably in the exam room in no acute distress. MENTAL STATUS:  Alert and oriented to person, place and time. HEAD:  Dark hair.  Normocephalic, atraumatic, face symmetric, no Cushingoid features. EYES:  Blue eyes.  Pupils equal round and reactive to light and accomodation.  No conjunctivitis or scleral icterus. ENT:  Oropharynx clear without lesion.  Tongue normal. Mucous membranes moist.  RESPIRATORY:  Clear to auscultation without rales, wheezes or rhonchi. CARDIOVASCULAR:  Regular rate and rhythm without murmur, rub or gallop. ABDOMEN:  Soft, non-tender, with active bowel sounds, and no hepatosplenomegaly.  No masses. SKIN:  No rashes, ulcers or lesions. EXTREMITIES: No edema, no skin discoloration or tenderness.  No palpable cords. LYMPH NODES: No palpable cervical, supraclavicular, axillary or inguinal adenopathy  NEUROLOGICAL: Right hand numbness. PSYCH:  Appropriate.  No visits with results within 3 Day(s) from this visit.  Latest known visit with results is:  Admission on 07/06/2016, Discharged on 07/08/2016  Component Date Value Ref Range Status  . Sodium 07/06/2016 137  135 - 145 mmol/L Final  . Potassium 07/06/2016 3.6  3.5 - 5.1 mmol/L Final  . Chloride 07/06/2016 104  101 - 111 mmol/L Final  . CO2 07/06/2016 23  22 - 32 mmol/L Final  . Glucose, Bld 07/06/2016 125* 65 - 99 mg/dL Final  . BUN 07/06/2016 13  6 - 20 mg/dL Final  . Creatinine, Ser 07/06/2016 1.17  0.61 - 1.24 mg/dL Final  . Calcium 07/06/2016 9.4  8.9 - 10.3 mg/dL Final  . GFR calc non Af Amer 07/06/2016 >60  >60 mL/min Final  . GFR calc Af Amer 07/06/2016 >60  >60 mL/min Final   Comment: (NOTE) The eGFR has been calculated using the CKD EPI equation. This calculation has not been validated in all clinical situations. eGFR's persistently <60 mL/min signify possible Chronic Kidney Disease.   . Anion gap  07/06/2016 10  5 - 15 Final  . WBC 07/06/2016 8.3  3.8 - 10.6 K/uL Final  . RBC 07/06/2016 5.62  4.40 - 5.90 MIL/uL Final  . Hemoglobin 07/06/2016 17.2  13.0 - 18.0 g/dL Final  . HCT 07/06/2016 47.7  40.0 - 52.0 % Final  . MCV 07/06/2016 84.9  80.0 - 100.0 fL Final  . MCH 07/06/2016 30.6  26.0 - 34.0 pg Final  . MCHC 07/06/2016 36.1* 32.0 - 36.0 g/dL Final  . RDW 07/06/2016 12.7  11.5 - 14.5 % Final  . Platelets 07/06/2016 251  150 - 440 K/uL Final  . Color, Urine 07/06/2016 YELLOW* YELLOW Final  . APPearance 07/06/2016 CLEAR* CLEAR Final  . Glucose, UA 07/06/2016 NEGATIVE  NEGATIVE mg/dL Final  . Bilirubin Urine 07/06/2016 NEGATIVE  NEGATIVE Final  . Ketones, ur 07/06/2016 NEGATIVE  NEGATIVE mg/dL Final  . Specific Gravity, Urine 07/06/2016 1.014  1.005 - 1.030 Final  . Hgb urine dipstick 07/06/2016 NEGATIVE  NEGATIVE Final  . pH 07/06/2016 6.0  5.0 - 8.0 Final  . Protein, ur 07/06/2016 NEGATIVE  NEGATIVE mg/dL Final  . Nitrite 07/06/2016 NEGATIVE  NEGATIVE Final  . Leukocytes, UA 07/06/2016 NEGATIVE  NEGATIVE Final  . RBC / HPF 07/06/2016 NONE SEEN  0 - 5 RBC/hpf Final  . WBC, UA 07/06/2016 0-5  0 - 5 WBC/hpf Final  . Bacteria, UA 07/06/2016 NONE SEEN  NONE SEEN Final  . Squamous Epithelial / LPF 07/06/2016 NONE SEEN  NONE SEEN Final  . Mucous 07/06/2016 PRESENT   Final  . Tricyclic, Ur Screen 93/79/0240 NONE DETECTED  NONE DETECTED Final  . Amphetamines, Ur Screen 07/06/2016 NONE DETECTED  NONE DETECTED Final  . MDMA (Ecstasy)Ur Screen 07/06/2016 NONE DETECTED  NONE DETECTED Final  . Cocaine Metabolite,Ur [place] 07/06/2016 NONE DETECTED  NONE DETECTED Final  . Opiate, Ur Screen 07/06/2016 NONE DETECTED  NONE DETECTED Final  . Phencyclidine (PCP) Ur S 07/06/2016 NONE DETECTED  NONE DETECTED Final  . Cannabinoid 50 Ng, Ur [place] 07/06/2016 NONE DETECTED  NONE DETECTED Final  . Barbiturates, Ur Screen 07/06/2016 NONE DETECTED  NONE DETECTED Final  . Benzodiazepine, Ur Scrn 07/06/2016 NONE  DETECTED  NONE DETECTED Final  . Methadone Scn, Ur 07/06/2016 NONE DETECTED  NONE DETECTED Final   Comment: (NOTE) 973  Tricyclics, urine               Cutoff 1000 ng/mL 200  Amphetamines, urine             Cutoff 1000 ng/mL 300  MDMA (Ecstasy), urine           Cutoff 500 ng/mL 400  Cocaine Metabolite, urine       Cutoff 300 ng/mL 500  Opiate, urine                   Cutoff 300 ng/mL 600  Phencyclidine (PCP), urine      Cutoff 25 ng/mL 700  Cannabinoid, urine              Cutoff 50 ng/mL 800  Barbiturates, urine             Cutoff 200 ng/mL 900  Benzodiazepine, urine           Cutoff 200 ng/mL 1000 Methadone, urine                Cutoff 300 ng/mL 1100 1200 The urine drug screen provides only a preliminary, unconfirmed 1300 analytical test result and should not be used for non-medical 1400 purposes. Clinical consideration and professional judgment should 1500 be applied to any positive drug screen result due to possible 1600 interfering substances. A more specific alternate chemical method 1700 must be used in order to obtain a confirmed analytical result.  1800 Gas chromato                          graphy / mass spectrometry (GC/MS) is the preferred 1900 confirmatory method.   . Weight 07/07/2016 3316.8  oz Final  . Height 07/07/2016 70  in Final  . BP 07/07/2016 153/91  mmHg Final  . Hgb A1c MFr Bld 07/06/2016 4.8  4.0 - 6.0 % Final  . Sodium 07/07/2016 141  135 - 145 mmol/L Final  . Potassium 07/07/2016 3.8  3.5 - 5.1 mmol/L Final  . Chloride 07/07/2016 109  101 - 111 mmol/L Final  . CO2 07/07/2016 29  22 - 32 mmol/L Final  . Glucose, Bld 07/07/2016 114* 65 - 99 mg/dL Final  . BUN 07/07/2016 14  6 - 20 mg/dL Final  . Creatinine, Ser 07/07/2016 1.09  0.61 - 1.24 mg/dL Final  . Calcium 07/07/2016 9.0  8.9 - 10.3 mg/dL Final  . GFR calc non Af Amer 07/07/2016 >60  >60 mL/min Final  . GFR calc Af Amer 07/07/2016 >60  >60 mL/min Final   Comment: (NOTE) The eGFR has been calculated  using the CKD EPI equation. This calculation has not been validated in all clinical situations. eGFR's persistently <60 mL/min signify possible Chronic Kidney Disease.   . Anion gap 07/07/2016 3* 5 - 15 Final  . WBC 07/07/2016 7.9  3.8 - 10.6 K/uL Final  . RBC 07/07/2016 4.87  4.40 - 5.90 MIL/uL Final  . Hemoglobin 07/07/2016 15.4  13.0 - 18.0 g/dL Final  . HCT 07/07/2016 41.5  40.0 - 52.0 % Final  . MCV 07/07/2016 85.2  80.0 - 100.0 fL Final  . MCH 07/07/2016 31.6  26.0 - 34.0 pg Final  . MCHC 07/07/2016 37.1* 32.0 - 36.0 g/dL Final  . RDW 07/07/2016 12.4  11.5 - 14.5 % Final  . Platelets 07/07/2016 193  150 - 440 K/uL Final  . Cholesterol 07/07/2016 185  0 - 200 mg/dL Final  . Triglycerides 07/07/2016 200* <150 mg/dL Final  . HDL 07/07/2016 29* >40 mg/dL Final  . Total CHOL/HDL Ratio 07/07/2016 6.4  RATIO Final  .  VLDL 07/07/2016 40  0 - 40 mg/dL Final  . LDL Cholesterol 07/07/2016 116* 0 - 99 mg/dL Final   Comment:        Total Cholesterol/HDL:CHD Risk Coronary Heart Disease Risk Table                     Men   Women  1/2 Average Risk   3.4   3.3  Average Risk       5.0   4.4  2 X Average Risk   9.6   7.1  3 X Average Risk  23.4   11.0        Use the calculated Patient Ratio above and the CHD Risk Table to determine the patient's CHD Risk.        ATP III CLASSIFICATION (LDL):  <100     mg/dL   Optimal  100-129  mg/dL   Near or Above                    Optimal  130-159  mg/dL   Borderline  160-189  mg/dL   High  >190     mg/dL   Very High   . WBC 07/08/2016 8.2  3.8 - 10.6 K/uL Final  . RBC 07/08/2016 4.97  4.40 - 5.90 MIL/uL Final  . Hemoglobin 07/08/2016 15.6  13.0 - 18.0 g/dL Final  . HCT 07/08/2016 42.9  40.0 - 52.0 % Final  . MCV 07/08/2016 86.4  80.0 - 100.0 fL Final  . MCH 07/08/2016 31.4  26.0 - 34.0 pg Final  . MCHC 07/08/2016 36.4* 32.0 - 36.0 g/dL Final  . RDW 07/08/2016 12.5  11.5 - 14.5 % Final  . Platelets 07/08/2016 203  150 - 440 K/uL Final  .  Sodium 07/08/2016 141  135 - 145 mmol/L Final  . Potassium 07/08/2016 3.8  3.5 - 5.1 mmol/L Final  . Chloride 07/08/2016 107  101 - 111 mmol/L Final  . CO2 07/08/2016 27  22 - 32 mmol/L Final  . Glucose, Bld 07/08/2016 102* 65 - 99 mg/dL Final  . BUN 07/08/2016 12  6 - 20 mg/dL Final  . Creatinine, Ser 07/08/2016 0.98  0.61 - 1.24 mg/dL Final  . Calcium 07/08/2016 8.8* 8.9 - 10.3 mg/dL Final  . GFR calc non Af Amer 07/08/2016 >60  >60 mL/min Final  . GFR calc Af Amer 07/08/2016 >60  >60 mL/min Final   Comment: (NOTE) The eGFR has been calculated using the CKD EPI equation. This calculation has not been validated in all clinical situations. eGFR's persistently <60 mL/min signify possible Chronic Kidney Disease.   . Anion gap 07/08/2016 7  5 - 15 Final    Assessment:  KIMBALL APPLEBY is a 50 y.o. male with an acute cerebellar CVA on 07/05/2016.  He has no personal or family history of thrombosis.  He has no known history of cardiac arrhythmia.  He denies any smoking history or drug use.   Head MRI on 07/06/2016 revealed acute infarct in the right PICA territory. There were scattered small areas of acute infarct in the right inferior cerebellum including the right cerebellar tonsil on the right  Carotid ultrasound on 07/06/2016 was negative.  Echo on 07/06/2016 was unremarkable.  CT angiogram of the head on 07/07/2016 revealed no anterior circulation abnormality.  Transesophageal echocardiogram (TEE) on 08/17/2016 revealed an ejection fraction of 65%.  There was no evidence of vegetation on any valve.  There was no evidence of  thrombus in the atrial cavity or appendage.  There was no defect or patent foramen ovale.  There was no right to left shunt.  48-hour Holter which was negative. He is currently undergoing a 30 day Holter.  Hypercoagulable workup on 07/26/2016 revealed a hematocrit of 41.4, hemoglobin 14.9, MCV 84.5, platelets 257,000, WBC 6100 with a normal differential.   Negative/normal studies included a lupus anticoagulant panel, anticardiolipin antibodies, beta-2 glycoprotein, PTT, methyltetrahydrofolate reductase (MTHFR), homocystine, AT-III antigen (110%) and activity (104%), and protein C activity (167%).  Protein S activity was 61% (63 - 140%).  Symptomatically, he is doing well.  He only notes some residual right hand numbness.  He is currently on aspirin and atorvastatin.  Plan: 1.  Discuss the patient's recent CVA and hypercoagulable workup.  Outside lab results will be obtained to ensure testing has been performed for prothrombin gene mutation, protein S and C antigen.  Discuss unlikely significance of slightly low protein S activity.  Protein S can be transiently decreased in patients with acute thrombosis.  Discuss retesting of protein S activity with the upcoming workup on 10/05/2016.  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  He is currently undergoing 1 month Holter monitoring.  Discuss consideration of more unusual testing that can be done at the [facility] including rare impaired fibrinolytic pathways such as tissue plasminogen activator (TPA) release or increased plasminogen activator inhibitor (PAI).   Plasminogen deficiency and heparin cofactor II deficiency may be tested.  Factor XII deficiency is ruled out with normal PTT.  2.  Follow-up with [facility] re: unusual hypercoagulable testing available. 3.  RTC on 10/05/2016 for labs (prothrombin gene mutation, protein S antigen and activity, protein C antigen, fibrinogen). 4.  RTC on 10/17/2016 for MD assessment and review of work-up.   Lequita Asal, MD  09/16/2016

## 2016-09-17 ENCOUNTER — Encounter: Payer: Self-pay | Admitting: Hematology and Oncology

## 2016-09-28 ENCOUNTER — Encounter: Payer: Self-pay | Admitting: Family Medicine

## 2016-09-28 ENCOUNTER — Ambulatory Visit (INDEPENDENT_AMBULATORY_CARE_PROVIDER_SITE_OTHER): Payer: BC Managed Care – PPO | Admitting: Family Medicine

## 2016-09-28 VITALS — BP 140/78 | HR 82 | Temp 98.7°F | Resp 16 | Wt 217.0 lb

## 2016-09-28 DIAGNOSIS — E782 Mixed hyperlipidemia: Secondary | ICD-10-CM

## 2016-09-28 DIAGNOSIS — I639 Cerebral infarction, unspecified: Secondary | ICD-10-CM | POA: Diagnosis not present

## 2016-09-28 NOTE — Progress Notes (Signed)
Patient: Nathaniel Gonzalez Male    DOB: [birth year]   50 y.o.   MRN: [id number] Visit Date: 09/28/2016  Today's Provider: Wilhemena Durie, MD   Chief Complaint  Patient presents with  . Follow-up   Subjective:    HPI  Cerebral infarction due to unspecified mechanism From 07/14/2016-Ambulatory referral to Physical Therapy. Ambulatory referral to Occupational Therapy.   Cerebellar stroke/embolic From 0000000 had a complete workup in the hospital. Will see Dr. Manuella Ghazi. for follow-up. Continue full dose aspirin indefinitely. Consider cardiology evaluation. Patient now on statin and aspirin. months.    Allergies  Allergen Reactions  . Erythromycin Hives     Current Outpatient Prescriptions:  .  aspirin EC 325 MG EC tablet, Take 1 tablet (325 mg total) by mouth daily., Disp: 30 tablet, Rfl: 0 .  atorvastatin (LIPITOR) 40 MG tablet, Take 1 tablet (40 mg total) by mouth daily at 6 PM., Disp: 30 tablet, Rfl: 12 .  Cholecalciferol (VITAMIN D-1000 MAX ST) 1000 units tablet, Take by mouth., Disp: , Rfl:  .  fluticasone (FLONASE) 50 MCG/ACT nasal spray, Place 2 sprays into both nostrils daily as needed. Reported on 06/09/2016, Disp: , Rfl:   Review of Systems  Constitutional: Negative for appetite change, chills and fever.  Respiratory: Negative for chest tightness, shortness of breath and wheezing.   Cardiovascular: Negative for chest pain and palpitations.  Gastrointestinal: Negative for abdominal pain, nausea and vomiting.  Allergic/Immunologic: Negative.   Neurological: Positive for weakness and numbness.       The only symptom he has from his stroke is continued mild weakness and numbness of the right hand. This is improved dramatically since his stroke.  Hematological: Negative.   Psychiatric/Behavioral: Negative.     Social History  Substance Use Topics  . Smoking status: Never Smoker  . Smokeless tobacco: Never Used  . Alcohol use No   Objective:     BP 140/78 (BP Location: Left Arm, Patient Position: Sitting, Cuff Size: Large)   Pulse 82   Temp 98.7 F (37.1 C) (Oral)   Resp 16   Wt 217 lb (98.4 kg)   SpO2 98%   BMI 31.14 kg/m   Physical Exam  Constitutional: He is oriented to person, place, and time. He appears well-developed and well-nourished.  HENT:  Head: Normocephalic and atraumatic.  Eyes: Conjunctivae are normal. No scleral icterus.  Neck: No thyromegaly present.  Cardiovascular: Normal rate, regular rhythm and normal heart sounds.   Pulmonary/Chest: Effort normal and breath sounds normal.  Abdominal: Soft.  Lymphadenopathy:    He has no cervical adenopathy.  Neurological: He is alert and oriented to person, place, and time. No cranial nerve deficit. He exhibits normal muscle tone. Coordination normal.  Grossly nonfocal exam.  Skin: Skin is warm and dry.  Psychiatric: He has a normal mood and affect. His behavior is normal. Judgment and thought content normal.        Assessment & Plan:        Acute cerebellar CVA on 07/05/2016 This is almost completely resolved except for mild weakness numbness of the right hand which I think will resolve over the next year. More than 25 minutes spent on this visit with50% time spent in counseling. He has had a complete workup including neurology, cardiology, hematology. All of these notes are in the chart. The only thing he has yet to finish is Holter/loop monitor. He finishes this next week. He has follow-up with all the specialists  over the next 2 months. Hyperlipidemia Treated Borderline hypertension Patient is now being released to exercise again. He needs to lose a little weight. Recent blood pressures are been in the high 130s over 70s. He wishes to work on his habits for the time being and I'll see him back in a new worried to reassess. Lopressor remains elevated this will need to be treated and will probably treat with losartan. I have done the exam and reviewed the  chart and it is accurate to the best of my knowledge. Miguel Aschoff M.D. [place], MD  [facility]

## 2016-10-05 ENCOUNTER — Inpatient Hospital Stay: Payer: BC Managed Care – PPO | Attending: Hematology and Oncology

## 2016-10-05 DIAGNOSIS — R51 Headache: Secondary | ICD-10-CM | POA: Diagnosis not present

## 2016-10-05 DIAGNOSIS — R5383 Other fatigue: Secondary | ICD-10-CM | POA: Insufficient documentation

## 2016-10-05 DIAGNOSIS — I69898 Other sequelae of other cerebrovascular disease: Secondary | ICD-10-CM | POA: Diagnosis not present

## 2016-10-05 DIAGNOSIS — R200 Anesthesia of skin: Secondary | ICD-10-CM | POA: Diagnosis not present

## 2016-10-05 DIAGNOSIS — Z808 Family history of malignant neoplasm of other organs or systems: Secondary | ICD-10-CM | POA: Insufficient documentation

## 2016-10-05 DIAGNOSIS — Z8673 Personal history of transient ischemic attack (TIA), and cerebral infarction without residual deficits: Secondary | ICD-10-CM | POA: Diagnosis present

## 2016-10-05 DIAGNOSIS — Z7982 Long term (current) use of aspirin: Secondary | ICD-10-CM | POA: Insufficient documentation

## 2016-10-05 DIAGNOSIS — Z79899 Other long term (current) drug therapy: Secondary | ICD-10-CM | POA: Insufficient documentation

## 2016-10-05 DIAGNOSIS — I639 Cerebral infarction, unspecified: Secondary | ICD-10-CM

## 2016-10-05 LAB — FIBRINOGEN: Fibrinogen: 322 mg/dL (ref 210–475)

## 2016-10-06 LAB — PROTEIN S, TOTAL: Protein S Ag, Total: 98 % (ref 60–150)

## 2016-10-06 LAB — PROTEIN S ACTIVITY: Protein S Activity: 138 % (ref 63–140)

## 2016-10-07 LAB — PROTEIN C, TOTAL: Protein C, Total: 128 % (ref 60–150)

## 2016-10-10 LAB — PROTHROMBIN GENE MUTATION

## 2016-10-17 ENCOUNTER — Inpatient Hospital Stay (HOSPITAL_BASED_OUTPATIENT_CLINIC_OR_DEPARTMENT_OTHER): Payer: BC Managed Care – PPO | Admitting: Hematology and Oncology

## 2016-10-17 VITALS — BP 130/77 | HR 76 | Temp 95.6°F | Resp 18 | Wt 217.6 lb

## 2016-10-17 DIAGNOSIS — R5383 Other fatigue: Secondary | ICD-10-CM

## 2016-10-17 DIAGNOSIS — R51 Headache: Secondary | ICD-10-CM

## 2016-10-17 DIAGNOSIS — I69898 Other sequelae of other cerebrovascular disease: Secondary | ICD-10-CM | POA: Diagnosis not present

## 2016-10-17 DIAGNOSIS — Z7982 Long term (current) use of aspirin: Secondary | ICD-10-CM

## 2016-10-17 DIAGNOSIS — I639 Cerebral infarction, unspecified: Secondary | ICD-10-CM

## 2016-10-17 DIAGNOSIS — Z8673 Personal history of transient ischemic attack (TIA), and cerebral infarction without residual deficits: Secondary | ICD-10-CM | POA: Diagnosis not present

## 2016-10-17 DIAGNOSIS — R200 Anesthesia of skin: Secondary | ICD-10-CM

## 2016-10-17 DIAGNOSIS — Z79899 Other long term (current) drug therapy: Secondary | ICD-10-CM

## 2016-10-17 NOTE — Progress Notes (Signed)
Patient here today for lab results. 

## 2016-10-17 NOTE — Progress Notes (Signed)
[facility] day:  10/17/2016   Chief Complaint: TYREKE BASSINGER is a 50 y.o. male with a history of a right cerebellar CVA who is seen for review of work-up and discussion regarding direction of therapy.  HPI: The patient was last seen in the hematology clinic on 09/16/2016.  At that time, he was seen for initial assessment.  He noted some residual right hand numbness.  He was on aspirin and atorvastatin.  Work-up was ongoing.  Initial hypercoagulable work-up revealed a protein S activity of  61% (63 - 140%).  Labs on 10/05/2016 revealed the following normal labs: protein C total (128%), protein S activity (138%), protein S total (98%), prothrombin gene mutation.  Fibrinogen was 322 (normal).  During the interim, he underwent a thirty day Holter monitor.  There were no events.  He has followed up with Dr. Jennings Books, neurologist.   Past Medical History:  Diagnosis Date  . Allergy   . Chicken pox   . Medical history non-contributory   . Stroke [facility])     Past Surgical History:  Procedure Laterality Date  . COLONOSCOPY  2011  . MOUTH SURGERY    . SKIN LESION EXCISION     several, he goes yearly  . TEE WITHOUT CARDIOVERSION N/A 08/17/2016   Procedure: TRANSESOPHAGEAL ECHOCARDIOGRAM (TEE);  Surgeon: Corey Skains, MD;  Location: [facility] ORS;  Service: Cardiovascular;  Laterality: N/A;    Family History  Problem Relation Age of Onset  . Skin cancer Mother   . Colon polyps Mother   . Cancer Mother     skin, ureter cancer  . Heart attack Father   . COPD Father   . Aneurysm Father   . Colon polyps Father   . Heart disease Father   . Hypertension Brother   . Cancer Maternal Aunt     skin  . Cancer Maternal Grandmother     unknown type  . Heart disease Maternal Grandmother   . Cancer Paternal Grandmother     liver  . Heart disease Paternal Grandfather     died age 61 MI  . Cancer Cousin     skin    Social History:   reports that he has never smoked. He has never used smokeless tobacco. He reports that he does not drink alcohol or use drugs.  He denies any drug use.  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  The patient is alone today.  Allergies:  Allergies  Allergen Reactions  . Erythromycin Hives    Current Medications: Current Outpatient Prescriptions  Medication Sig Dispense Refill  . aspirin EC 325 MG EC tablet Take 1 tablet (325 mg total) by mouth daily. 30 tablet 0  . atorvastatin (LIPITOR) 40 MG tablet Take 1 tablet (40 mg total) by mouth daily at 6 PM. 30 tablet 12  . Cholecalciferol (VITAMIN D-1000 MAX ST) 1000 units tablet Take by mouth.    . fluticasone (FLONASE) 50 MCG/ACT nasal spray Place 2 sprays into both nostrils daily as needed. Reported on 06/09/2016     No current facility-administered medications for this visit.     Review of Systems:  GENERAL:  Feels good.  Tires quickly.  No fevers, sweats or weight loss. PERFORMANCE STATUS (ECOG):  0 HEENT:  Runny nose.  No visual changes, sore throat, mouth sores or tenderness. Lungs: No shortness of breath or cough.  No hemoptysis. Cardiac:  No chest pain, palpitations, orthopnea, or PND. GI:  No nausea, vomiting, diarrhea, constipation, melena or hematochezia. GU:  No urgency, frequency, dysuria, or hematuria. Musculoskeletal:  No back pain.  No joint pain.  No muscle tenderness. Extremities:  No pain or swelling. Skin:  No rashes or skin changes. Neuro:  Sinus headache.  Right hand numbness.  No weakness, balance or coordination issues. Endocrine:  No diabetes, thyroid issues, hot flashes or night sweats. Psych:  No mood changes, depression or anxiety. Pain:  No focal pain. Review of systems:  All other systems reviewed and found to be negative.  Physical Exam: There were no vitals taken for this visit. GENERAL:  Well developed, well nourished, sitting comfortably in the exam room in no  acute distress. MENTAL STATUS:  Alert and oriented to person, place and time. HEAD:  Dark hair.  Normocephalic, atraumatic, face symmetric, no Cushingoid features. EYES:  Blue eyes.  Pupils equal round and reactive to light and accomodation.  No conjunctivitis or scleral icterus. ENT:  Oropharynx clear without lesion.  Tongue normal. Mucous membranes moist.  RESPIRATORY:  Clear to auscultation without rales, wheezes or rhonchi. CARDIOVASCULAR:  Regular rate and rhythm without murmur, rub or gallop. ABDOMEN:  Soft, non-tender, with active bowel sounds, and no hepatosplenomegaly.  No masses. SKIN:  No rashes, ulcers or lesions. EXTREMITIES: No edema, no skin discoloration or tenderness.  No palpable cords. LYMPH NODES: No palpable cervical, supraclavicular, axillary or inguinal adenopathy  NEUROLOGICAL: Right hand numbness. PSYCH:  Appropriate.   No visits with results within 3 Day(s) from this visit.  Latest known visit with results is:  Appointment on 10/05/2016  Component Date Value Ref Range Status  . Protein C, Total 10/07/2016 128  60 - 150 % Final   Comment: (NOTE) Performed At: [facility] [place], [place] [id number] Lindon Romp MD [id number]   . Protein S Activity 10/06/2016 138  63 - 140 % Final   Comment: (NOTE) Performed At: [facility] [place], [place] [id number] Lindon Romp MD [id number]   . Protein S Ag, Total 10/06/2016 98  60 - 150 % Final   Comment: (NOTE) This test was developed and its performance characteristics determined by [facility]. It has not been cleared or approved by the Food and Drug Administration. Performed At: [facility] [place], [place] [id number] Lindon Romp MD [id number]   . Recommendations-PTGENE: 10/10/2016 Comment   Final   Comment: (NOTE) NEGATIVE No mutation identified. Comment: A point mutation ([id number]) in the factor II (prothrombin) gene  is the second most common cause of inherited thrombophilia. The incidence of this mutation in the [place] Caucasian population is about 2% and in the [place] American population it is approximately 0.5%. This mutation is rare in the [place] and Native American population. Being heterozygous for a prothrombin mutation increases the risk for developing venous thrombosis about 2 to 3 times above the general population risk. Being homozygous for the prothrombin gene mutation increases the relative risk for venous thrombosis further, although it is not yet known how much further the risk is increased. In women heterozygous for the prothrombin gene mutation, the use of estrogen containing oral contraceptives increases the relative risk of venous thrombosis about 16 times and the risk of developing cerebral thrombosis is also significantly increased. In pregnancy the pr  othrombin gene mutation increases risk for venous thrombosis and may increase risk for stillbirth, placental abruption, pre-eclampsia and fetal growth restriction. If the patient possesses two or more congenital or acquired thrombophilic risk factors, the risk for thrombosis may rise to more than the sum of the risk ratios for the individual mutations. This assay detects only the prothrombin G20210A mutation and does not measure genetic abnormalities elsewhere in the genome. Other thrombotic risk factors may be pursued through systematic clinical laboratory analysis. These factors include the R506Q (Leiden) mutation in the Factor V gene, plasma homocysteine levels, as well as testing for deficiencies of antithrombin III, protein C and protein S.   . Additional Information 10/10/2016 Comment   Final   Comment: (NOTE) Genetic Counselors are available for health care providers to discuss results at [phone]). Methodology: DNA analysis of the Factor II gene was performed by PCR amplification  followed by restriction analysis. The diagnostic sensitivity is >99% for both. All the tests must be combined with clinical information for the most accurate interpretation. Molecular-based testing is highly accurate, but as in any laboratory test, diagnostic errors may occur. This test was developed and its performance characteristics determined by [facility]. It has not been cleared or approved by the Food and Drug Administration. Poort SR, et al. Blood. 1996PG:3238759. Varga EA. Circulation. 2004; J955636. Mervin Hack, et [place]; 19:700-703. Allison Quarry, PhD, [facility] Ruben Reason, PhD, [facility] Jens Som, PhD, [facility] Annetta Maw, M.S., PhD, [facility] Alfredo Bach, PhD, [facility] Norva Riffle, PhD, [facility] Earlean Polka,                           PhD, [facility] Performed At: [facility] [address] [place], [place] [id number] Nechama Guard MD [id number]   . Fibrinogen 10/05/2016 322  210 - 475 mg/dL Final    Assessment:  LARRELL FREDIANI is a 50 y.o. male with an acute cerebellar CVA on 07/05/2016.  He has no personal or family history of thrombosis.  He has no known history of cardiac arrhythmia.  He denies any smoking history or drug use.   Head MRI on 07/06/2016 revealed acute infarct in the right PICA territory. There were scattered small areas of acute infarct in the right inferior cerebellum including the right cerebellar tonsil on the right  Carotid ultrasound on 07/06/2016 was negative.  Echo on 07/06/2016 was unremarkable.  CT angiogram of the head on 07/07/2016 revealed no anterior circulation abnormality.  Transesophageal echocardiogram (TEE) on 08/17/2016 revealed an ejection fraction of 65%.  There was no evidence of vegetation on any valve.  There was no evidence of thrombus in the atrial cavity or appendage.  There was no defect or patent foramen ovale.  There was no right to left shunt.  48-hour Holter  which was negative. Thirty day Holter monitor revealed no events.  Hypercoagulable workup on 07/26/2016 revealed a hematocrit of 41.4, hemoglobin 14.9, MCV 84.5, platelets 257,000, WBC 6100 with a normal differential.  Negative/normal studies included Factor V Leiden, lupus anticoagulant panel, anticardiolipin antibodies, beta-2 glycoprotein, PTT, methyltetrahydrofolate reductase (MTHFR), homocystine, AT-III antigen (110%) and activity (104%), and protein C activity (167%).  Protein S activity was 61% (63-140%).  Additional hypercoagulable studies on 10/05/2016 revealed the following normal labs: protein C total (128%), protein S activity (138%), protein S total (98%), and prothrombin gene mutation.  Fibrinogen was 322 (normal).  Symptomatically, he is doing well.  He only notes some residual right hand numbness.  He is on aspirin and atorvastatin.  Plan: 1.  Review interval labs.  No evidence of protein S deficiency.  No abnormalities noted on hypercoagulable work-up.  Discuss more unusual etiologies.  Discuss consideration of testing for PNH.  Discuss follow-up with [facility] for other unusual etiologies. 2.  MD to call patients about additional labs to be drawn. 3.  RTC prn.  Addendum:  Spoke with [facility].  Agree with PNH testing.  Would screen for myeloproliferative disorder despite normal counts.  [facility] panel ID:  MPNR [id number].   Lequita Asal, MD  10/17/2016, 5:46 AM

## 2016-11-18 IMAGING — MR MR CERVICAL SPINE W/O CM
5 series · 33 of 48 positions shown · non-contrast
Comparison: MRI head today

CLINICAL DATA: Lightheaded.  Uncoordinated.  Right PICA stroke.

EXAM:
MRI CERVICAL SPINE WITHOUT CONTRAST
TECHNIQUE: Multiplanar, multisequence MR imaging of the cervical spine was
performed. No intravenous contrast was administered.

[Series 2: T2 · sagittal · 3.0mm · 0.56mm/px · 6 of 15 slices shown (1 of 2)]
[im 1/15]
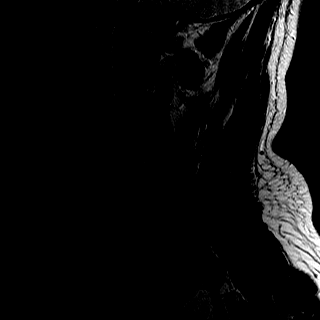
[im 3/15]
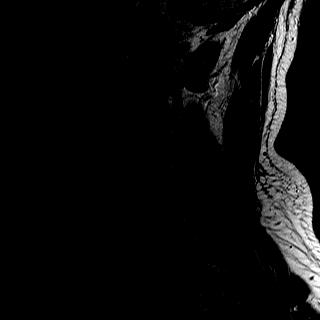
[im 6/15]
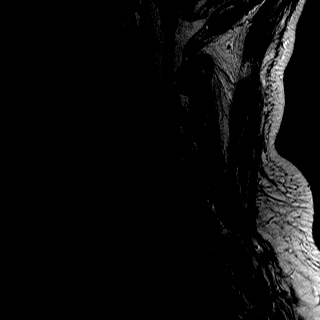
[im 9/15]
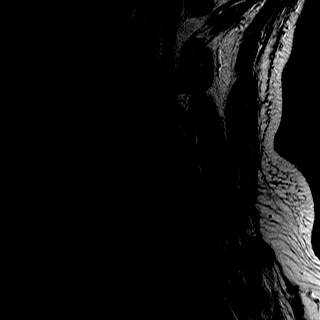
[im 12/15]
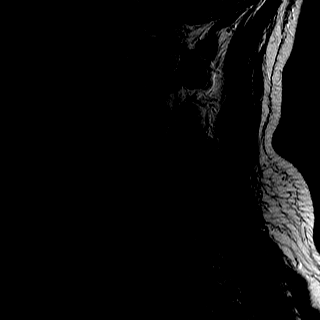
[im 15/15]
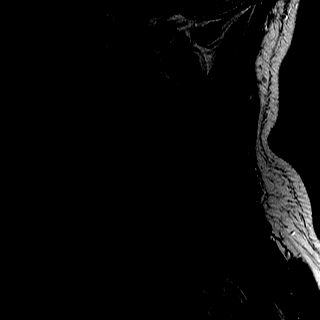

[Series 3: T1 · sagittal · 3.0mm · 0.70mm/px · 7 of 15 slices shown]
[im 1/15]
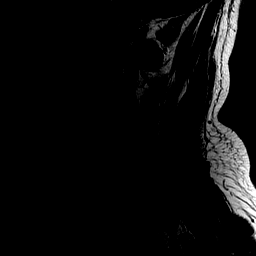
[im 3/15]
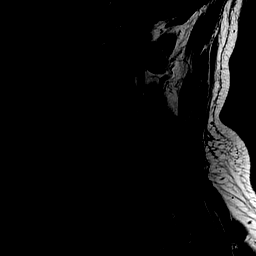
[im 5/15]
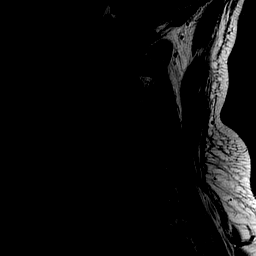
[im 8/15]
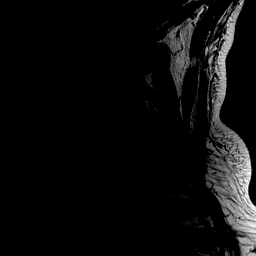
[im 10/15]
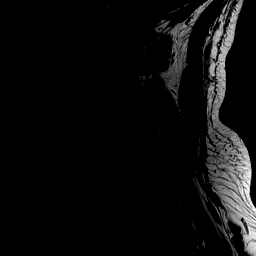
[im 12/15]
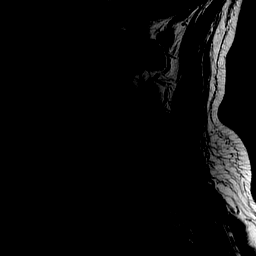
[im 15/15]
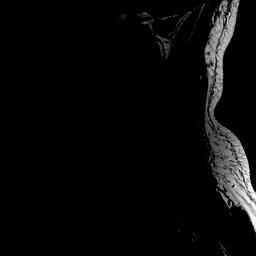

[Series 4: STIR · sagittal · 3.0mm · 0.35mm/px · 7 of 15 slices shown]
[im 1/15]
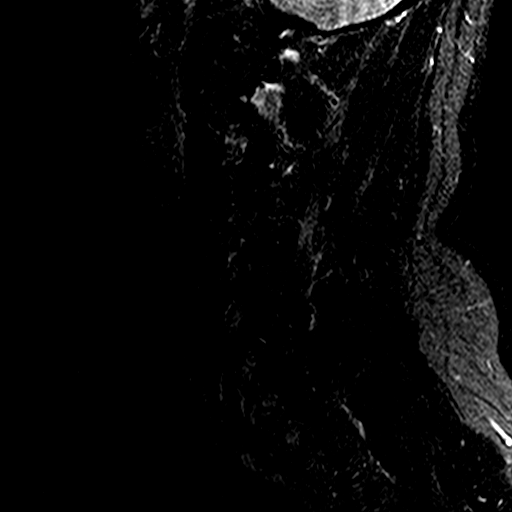
[im 3/15]
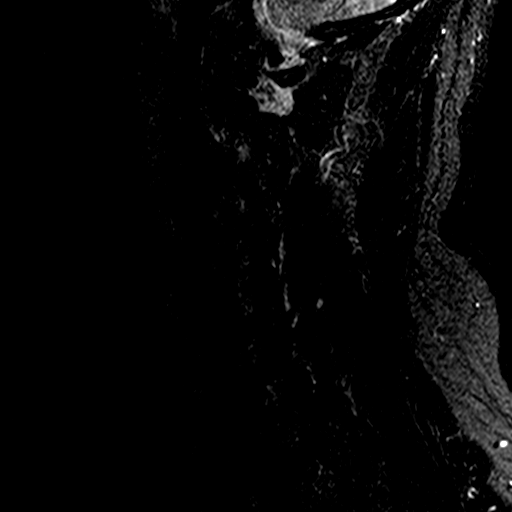
[im 5/15]
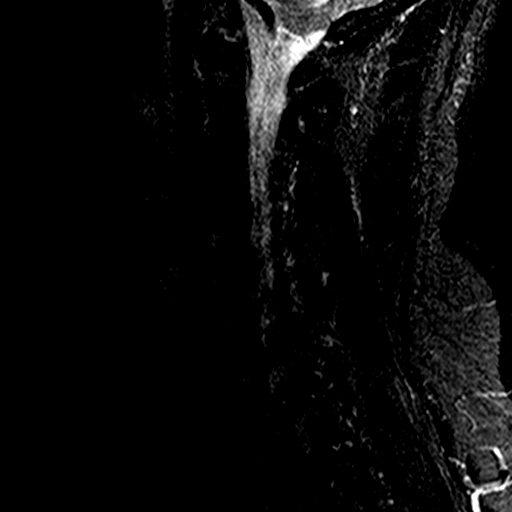
[im 8/15]
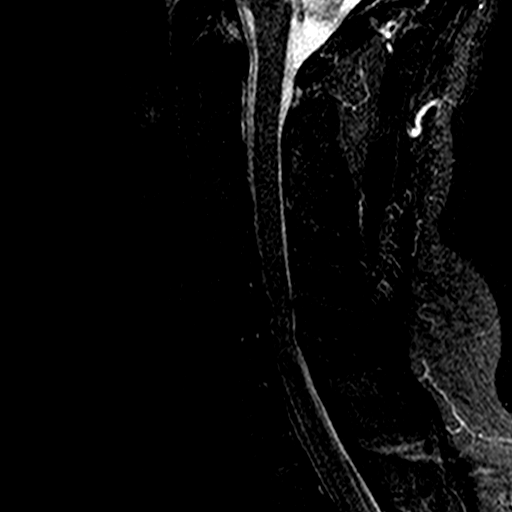
[im 10/15]
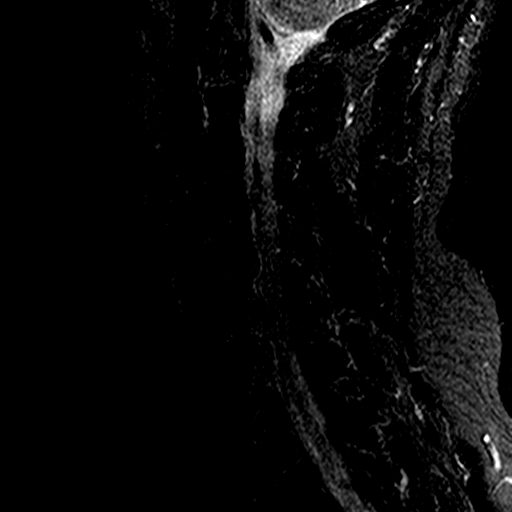
[im 12/15]
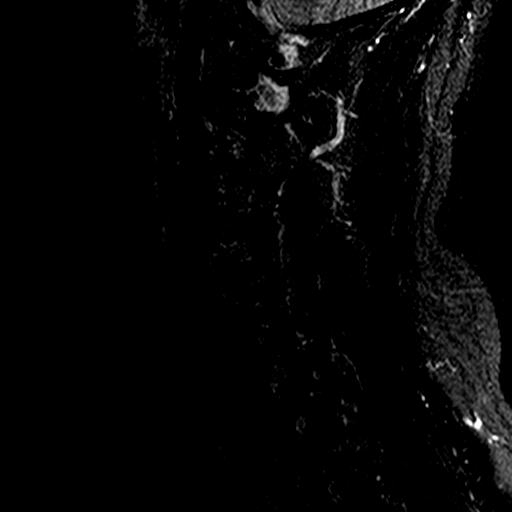
[im 15/15]
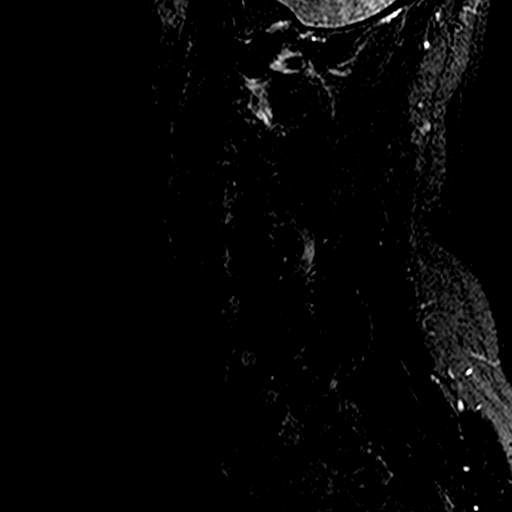

[Series 5: T2 · axial · 3.0mm · 0.70mm/px · z∈[-55,+57]mm · 8 of 30 slices shown (2 of 2)]
[im 1/30]
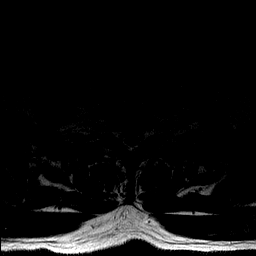
[im 5/30]
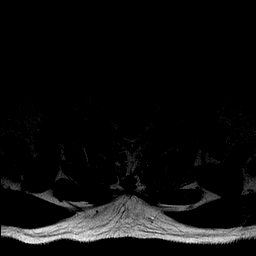
[im 9/30]
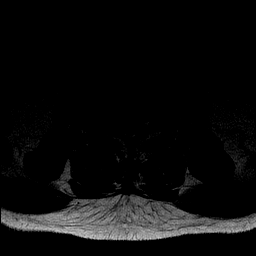
[im 14/30]
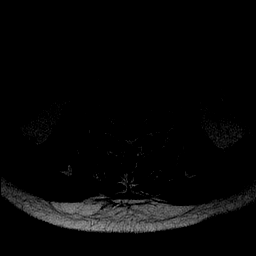
[im 16/30]
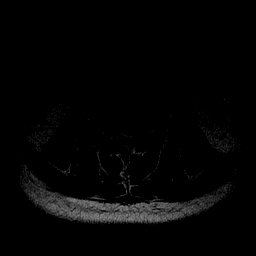
[im 21/30]
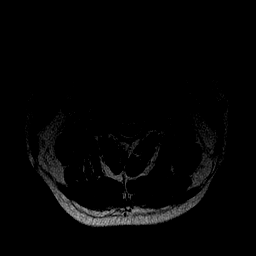
[im 25/30]
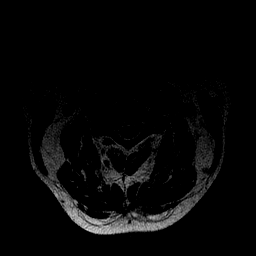
[im 30/30]
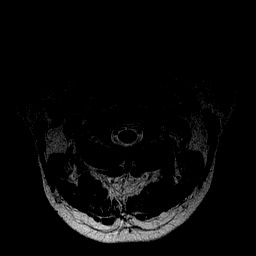

[Series 6: mpgr ax · axial · 3.0mm · 0.35mm/px · z∈[-55,+3]mm · 5 of 30 slices shown]
[im 1/30]
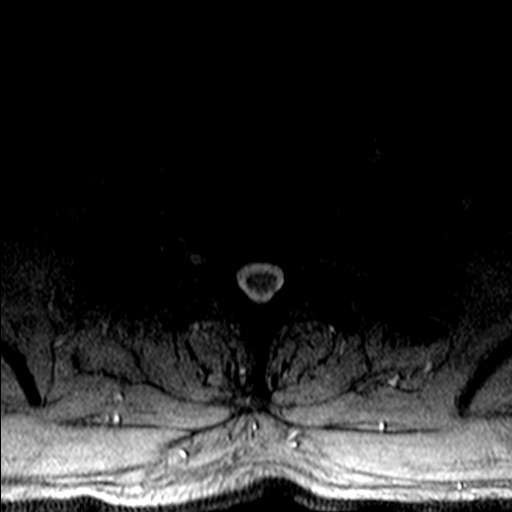
[im 5/30]
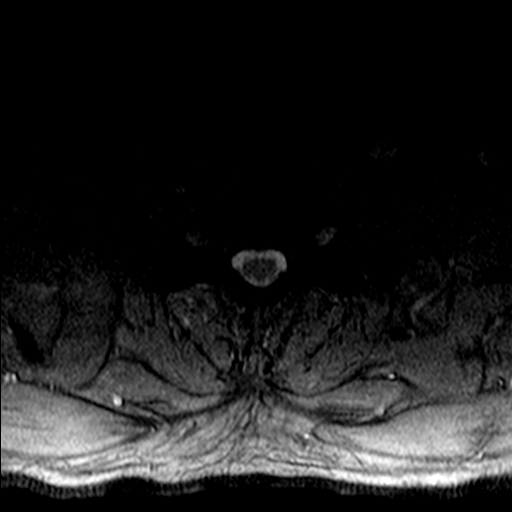
[im 9/30]
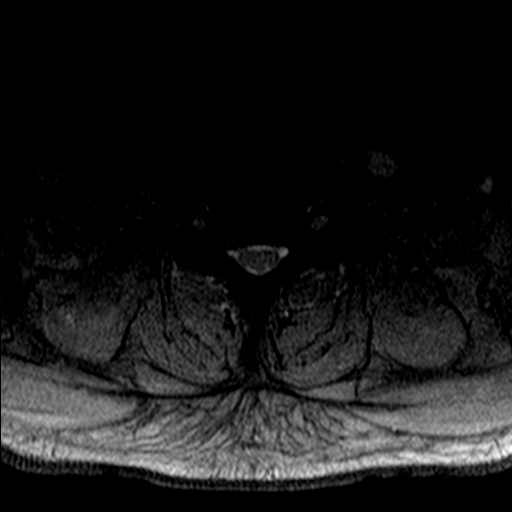
[im 14/30]
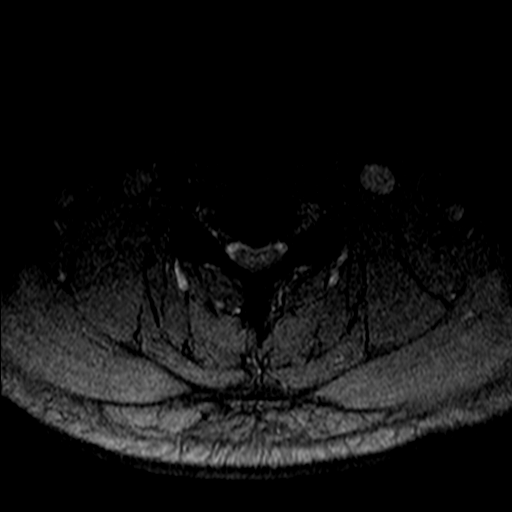
[im 16/30]
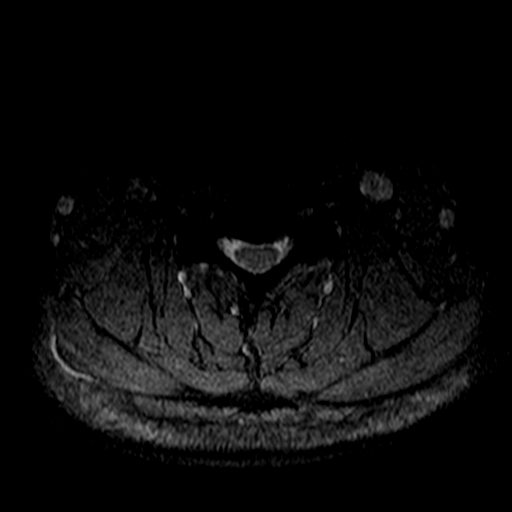

[33 of 48 positions shown; findings below may reference images not displayed]

FINDINGS: Alignment: Normal alignment.

Vertebrae: Negative for fracture or mass. Image quality degraded by
patient size and scan technique.

Cord: Cord signal normal.

Posterior Fossa, vertebral arteries, paraspinal tissues: Right PICA
infarct best seen on brain MRI done earlier today

Disc levels:

C2-3:  Negative

C3-4: Mild disc degeneration with mild uncinate spurring. Mild
foraminal narrowing bilaterally.

C4-5: Mild disc degeneration and spurring. Mild spinal stenosis and
mild foraminal stenosis bilaterally

C5-6: Central disc and osteophyte complex with cord flattening and
moderate spinal stenosis. Mild foraminal narrowing bilaterally.

C6-7: Disc degeneration with diffuse uncinate spurring. Mild spinal
stenosis and mild foraminal stenosis bilaterally.

C7-T1:  Negative
IMPRESSION: Mild spinal stenosis and mild foraminal narrowing bilaterally at
C4-5. Mild foraminal narrowing bilaterally at C3-4

Central disc and osteophyte complex at C5-6 with moderate spinal
stenosis and mild foraminal narrowing bilaterally

Mild spinal stenosis and mild foraminal stenosis bilaterally due to
spurring at C6-7.

## 2016-11-30 ENCOUNTER — Encounter: Payer: Self-pay | Admitting: Hematology and Oncology

## 2016-12-02 ENCOUNTER — Telehealth: Payer: Self-pay | Admitting: *Deleted

## 2016-12-02 NOTE — Telephone Encounter (Signed)
Called wife to let her know about the need for lab appt when we discovered what labs to draw and how to send to [facility] or [facility], John in lab working on specialized lab orders at this time, voiced understanding.

## 2016-12-08 ENCOUNTER — Other Ambulatory Visit: Payer: Self-pay | Admitting: *Deleted

## 2016-12-09 ENCOUNTER — Other Ambulatory Visit: Payer: Self-pay | Admitting: *Deleted

## 2016-12-09 ENCOUNTER — Telehealth: Payer: Self-pay | Admitting: *Deleted

## 2016-12-09 DIAGNOSIS — R29898 Other symptoms and signs involving the musculoskeletal system: Secondary | ICD-10-CM

## 2016-12-09 DIAGNOSIS — Z8371 Family history of colonic polyps: Secondary | ICD-10-CM

## 2016-12-09 DIAGNOSIS — L821 Other seborrheic keratosis: Secondary | ICD-10-CM

## 2016-12-09 NOTE — Telephone Encounter (Signed)
Contacted scheduler to have her call patient and set up lab appointment on one day between Monday-Thursday for [facility] and [facility].

## 2016-12-13 ENCOUNTER — Inpatient Hospital Stay: Payer: BC Managed Care – PPO | Attending: Hematology and Oncology

## 2016-12-13 DIAGNOSIS — R29898 Other symptoms and signs involving the musculoskeletal system: Secondary | ICD-10-CM

## 2016-12-13 DIAGNOSIS — Z83719 Family history of colon polyps, unspecified: Secondary | ICD-10-CM

## 2016-12-13 DIAGNOSIS — Z8371 Family history of colonic polyps: Secondary | ICD-10-CM

## 2016-12-13 DIAGNOSIS — L821 Other seborrheic keratosis: Secondary | ICD-10-CM

## 2016-12-14 LAB — MISC LABCORP TEST (SEND OUT): Labcorp test code: 9985

## 2016-12-23 LAB — MISC LABCORP TEST (SEND OUT): Labcorp test code: 9985

## 2016-12-26 ENCOUNTER — Ambulatory Visit (INDEPENDENT_AMBULATORY_CARE_PROVIDER_SITE_OTHER): Payer: BC Managed Care – PPO | Admitting: Family Medicine

## 2016-12-26 ENCOUNTER — Encounter: Payer: Self-pay | Admitting: Family Medicine

## 2016-12-26 VITALS — BP 140/78 | HR 82 | Temp 98.0°F | Resp 16 | Wt 219.0 lb

## 2016-12-26 DIAGNOSIS — I639 Cerebral infarction, unspecified: Secondary | ICD-10-CM

## 2016-12-26 DIAGNOSIS — I10 Essential (primary) hypertension: Secondary | ICD-10-CM

## 2016-12-26 DIAGNOSIS — R000 Tachycardia, unspecified: Secondary | ICD-10-CM | POA: Diagnosis not present

## 2016-12-26 MED ORDER — VERAPAMIL HCL ER 180 MG PO CP24: 180.0000 mg | ORAL_CAPSULE | Freq: Every day | ORAL | 12 refills | Status: DC

## 2016-12-26 NOTE — Progress Notes (Signed)
Subjective:  HPI   Hypertension, follow-up:  BP Readings from Last 3 Encounters:  12/26/16 140/78  10/17/16 130/77  09/28/16 140/78    He was last seen for hypertension 3 months ago.  BP at that visit was 130/77. Management since that visit includes none. He reports good compliance with treatment. He is not having side effects.  He is exercising but limited because he can not do like he was before his stroke.  He is adherent to low salt diet.   Outside blood pressures are not being checked. He is experiencing irregular heart beat and palpitations.  Patient denies chest pain, chest pressure/discomfort, claudication, dyspnea, exertional chest pressure/discomfort, fatigue, lower extremity edema, near-syncope, orthopnea, palpitations, paroxysmal nocturnal dyspnea, syncope and tachypnea.    Wt Readings from Last 3 Encounters:  12/26/16 219 lb (99.3 kg)  10/17/16 217 lb 9.5 oz (98.7 kg)  09/28/16 217 lb (98.4 kg)   ------------------------------------------------------------------------  He also reports that he had an episode of fast heart rate on night. He was laying in bed watching TV when he noticed his heart rate elevated he checked it is and it was 98. He has a history of palpations but this seemed different. He saw Cardiology in Nov 2017 for palpations.   Prior to Admission medications   Medication Sig Start Date End Date Taking? Authorizing Provider  aspirin EC 325 MG EC tablet Take 1 tablet (325 mg total) by mouth daily. 07/08/16   Max Sane, MD  atorvastatin (LIPITOR) 40 MG tablet Take 1 tablet (40 mg total) by mouth daily at 6 PM. 07/14/16   Jerrol Banana., MD  Cholecalciferol (VITAMIN D-1000 MAX ST) 1000 units tablet Take by mouth.    Historical Provider, MD  fluticasone (FLONASE) 50 MCG/ACT nasal spray Place 2 sprays into both nostrils daily as needed. Reported on 06/09/2016    Historical Provider, MD    Patient Active Problem List   Diagnosis Date Noted  .  Palpitations 07/25/2016  . Cerebral infarction ([place]) 07/07/2016  . Right arm weakness 07/06/2016  . 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 Family history of colonic polyps 11/12/2008    Past Medical History:  Diagnosis Date  . Allergy   . Chicken pox   . Medical history non-contributory   . Stroke [facility])     Social History   Social History  . Marital status: Married    Spouse name: N/A  . Number of children: N/A  . Years of education: N/A   Occupational History  . Not on file.   Social History Main Topics  . Smoking status: Never Smoker  . Smokeless tobacco: Never Used  . Alcohol use No  . Drug use: No  . Sexual activity: Not on file   Other Topics Concern  . Not on file   Social History Narrative  . No narrative on file    Allergies  Allergen Reactions  . Erythromycin Hives    Review of Systems  Constitutional: Negative.   HENT: Negative.   Eyes: Negative.   Respiratory: Negative.   Cardiovascular: Negative for chest pain, palpitations, orthopnea, claudication, leg swelling and PND.       Elevated heart rate  Gastrointestinal: Negative.   Genitourinary: Negative.   Musculoskeletal: Negative.   Skin: Negative.   Neurological: Negative.   Endo/Heme/Allergies: Negative.   Psychiatric/Behavioral: Negative.     Immunization History  Administered Date(s) Administered  . Tdap 11/12/2008    Objective:  BP 140/78 (BP Location: Left Arm,  Patient Position: Sitting, Cuff Size: Large)   Pulse 82   Temp 98 F (36.7 C) (Oral)   Resp 16   Wt 219 lb (99.3 kg)   BMI 31.42 kg/m   Physical Exam  Constitutional: He is oriented to person, place, and time and well-developed, well-nourished, and in no distress.  Eyes: Conjunctivae and EOM are normal. Pupils are equal, round, and reactive to light.  Neck: Normal range of motion. Neck supple.  Cardiovascular: Normal rate, regular rhythm, normal heart sounds and intact distal pulses.   Pulmonary/Chest: Effort normal  and breath sounds normal.  Musculoskeletal: Normal range of motion.  Neurological: He is alert and oriented to person, place, and time. He has normal reflexes. Gait normal. GCS score is 15.  Decreased fine motor skills on right hand  Skin: Skin is warm and dry.  Psychiatric: Mood, memory, affect and judgment normal.    Lab Results  Component Value Date   WBC 8.2 07/08/2016   HGB 15.6 07/08/2016   HCT 42.9 07/08/2016   PLT 203 07/08/2016   GLUCOSE 102 (H) 07/08/2016   CHOL 185 07/07/2016   TRIG 200 (H) 07/07/2016   HDL 29 (L) 07/07/2016   LDLCALC 116 (H) 07/07/2016   TSH 1.920 06/09/2016   HGBA1C 4.8 07/06/2016    CMP     Component Value Date/Time   NA 141 07/08/2016 0531   NA 143 06/09/2016 1102   K 3.8 07/08/2016 0531   CL 107 07/08/2016 0531   CO2 27 07/08/2016 0531   GLUCOSE 102 (H) 07/08/2016 0531   BUN 12 07/08/2016 0531   BUN 16 06/09/2016 1102   CREATININE 0.98 07/08/2016 0531   CALCIUM 8.8 (L) 07/08/2016 0531   PROT 7.2 06/09/2016 1102   ALBUMIN 4.6 06/09/2016 1102   AST 23 06/09/2016 1102   ALT 40 06/09/2016 1102   ALKPHOS 64 06/09/2016 1102   BILITOT 0.8 06/09/2016 1102   GFRNONAA >60 07/08/2016 0531   GFRAA >60 07/08/2016 0531    Assessment and Plan :  1. Cerebellar stroke (HCC)  - verapamil (VERELAN PM) 180 MG 24 hr capsule; Take 1 capsule (180 mg total) by mouth at bedtime.  Dispense: 30 capsule; Refill: 12  2. Rapid heart rate  - EKG 12-Lead  3. Essential hypertension  - verapamil (VERELAN PM) 180 MG 24 hr capsule; Take 1 capsule (180 mg total) by mouth at bedtime.  Dispense: 30 capsule; Refill: 12  HPI, Exam, and A&P Transcribed under the direction and in the presence of Naphtali L. Cranford Mon, MD  Electronically Signed: Katina Dung, CMA I have done the exam and reviewed the above chart and it is accurate to the best of my knowledge. Development worker, community has been used in this note in any air is in the dictation or transcription are  unintentional.  [facility] 12/26/2016 4:43 PM

## 2017-01-04 ENCOUNTER — Telehealth: Payer: Self-pay | Admitting: *Deleted

## 2017-01-04 NOTE — Telephone Encounter (Signed)
Left message with wife to have patient call us back for test results.

## 2017-01-06 ENCOUNTER — Telehealth: Payer: Self-pay | Admitting: *Deleted

## 2017-01-06 NOTE — Telephone Encounter (Signed)
Discussed PNH flow and myelodysplasic disorder labs from [facility] were negative per Dr. Mike Gip.  Patient voiced understanding but is still concerned as to what is going on.  Patient reports that he has an appt with PCP in March, neurology 01-20-17 and Cardiology in April.  Will check with Dr. Mike Gip as to when she would like a f/u appt and get back with the patient. Voiced understanding.

## 2017-01-09 ENCOUNTER — Telehealth: Payer: Self-pay | Admitting: *Deleted

## 2017-01-09 NOTE — Telephone Encounter (Signed)
DIscussed with patient that Dr. Mike Gip had completed a full workup and found no abnormality in his blood and that he did not need a f/u appt at this time, voiced understanding.

## 2017-01-24 ENCOUNTER — Encounter: Payer: Self-pay | Admitting: Physician Assistant

## 2017-01-24 ENCOUNTER — Ambulatory Visit (INDEPENDENT_AMBULATORY_CARE_PROVIDER_SITE_OTHER): Payer: BC Managed Care – PPO | Admitting: Physician Assistant

## 2017-01-24 VITALS — BP 128/82 | HR 72 | Temp 98.4°F | Resp 16 | Wt 217.0 lb

## 2017-01-24 DIAGNOSIS — B359 Dermatophytosis, unspecified: Secondary | ICD-10-CM | POA: Diagnosis not present

## 2017-01-24 MED ORDER — TERBINAFINE HCL 1 % EX CREA: 1.0000 "application " | TOPICAL_CREAM | Freq: Two times a day (BID) | CUTANEOUS | 0 refills | Status: DC

## 2017-01-24 NOTE — Progress Notes (Signed)
Patient: Nathaniel Gonzalez Male    DOB: 1966/04/29   51 y.o.   MRN: [id number] Visit Date: 01/24/2017  Today's Provider: Trinna Post, PA-C   Chief Complaint  Patient presents with  . Rash   Subjective:    Rash  This is a new problem. The problem has been gradually worsening since onset. The affected locations include the left hand. Pertinent negatives include no fatigue or fever. Past treatments include anti-itch cream and cold compress. The treatment provided no relief. There is no history of eczema.   Patient is a 51 y/o man presenting today for evaluation of rash on his left hand. Reports it started small like a pimple. He has some ketaconazole cream from his dermatologist that he used on it without relief. He doesn't know when he got this or when it expired. Today he presents with a red lesion on his left hand that is extremely pruritic.     Allergies  Allergen Reactions  . Erythromycin Hives     Current Outpatient Prescriptions:  .  aspirin EC 325 MG EC tablet, Take 1 tablet (325 mg total) by mouth daily., Disp: 30 tablet, Rfl: 0 .  atorvastatin (LIPITOR) 40 MG tablet, Take 1 tablet (40 mg total) by mouth daily at 6 PM., Disp: 30 tablet, Rfl: 12 .  Cholecalciferol (VITAMIN D-1000 MAX ST) 1000 units tablet, Take by mouth., Disp: , Rfl:  .  fluticasone (FLONASE) 50 MCG/ACT nasal spray, Place 2 sprays into both nostrils daily as needed. Reported on 06/09/2016, Disp: , Rfl:  .  verapamil (VERELAN PM) 180 MG 24 hr capsule, Take 1 capsule (180 mg total) by mouth at bedtime., Disp: 30 capsule, Rfl: 12  Review of Systems  Constitutional: Negative for activity change, appetite change, chills, diaphoresis, fatigue, fever and unexpected weight change.  Skin: Positive for rash. Negative for color change.    Social History  Substance Use Topics  . Smoking status: Never Smoker  . Smokeless tobacco: Never Used  . Alcohol use No   Objective:   BP 128/82 (BP Location:  Left Arm, Patient Position: Sitting, Cuff Size: Normal)   Pulse 72   Temp 98.4 F (36.9 C)   Resp 16   Wt 217 lb (98.4 kg)   BMI 31.14 kg/m   Physical Exam  Constitutional: He appears well-developed and well-nourished.  Skin: Skin is warm. Rash noted. There is erythema.           Assessment & Plan:     1. Ringworm  Use twice until improvement, and then twice daily for two weeks after. Do not use steroid creams. Patient to call back if worsening or not improving.  - terbinafine (LAMISIL) 1 % cream; Apply 1 application topically 2 (two) times daily.  Dispense: 30 g; Refill: 0  Return if symptoms worsen or fail to improve.   Patient Instructions  Body Ringworm Introduction Body ringworm is an infection of the skin that often causes a ring-shaped rash. Body ringworm can affect any part of your skin. It can spread easily to others. Body ringworm is also called tinea corporis. What are the causes? This condition is caused by funguses called dermatophytes. The condition develops when these funguses grow out of control on the skin. You can get this condition if you touch a person or animal that has it. You can also get it if you share clothing, bedding, towels, or any other object with an infected person or pet. What increases  the risk? This condition is more likely to develop in:  Athletes who often make skin-to-skin contact with other athletes, such as wrestlers.  People who share equipment and mats.  People with a weakened immune system. What are the signs or symptoms? Symptoms of this condition include:  Itchy, raised red spots and bumps.  Red scaly patches.  A ring-shaped rash. The rash may have:  A clear center.  Scales or red bumps at its center.  Redness near its borders.  Dry and scaly skin on or around it. How is this diagnosed? This condition can usually be diagnosed with a skin exam. A skin scraping may be taken from the affected area and examined under  a microscope to see if the fungus is present. How is this treated? This condition may be treated with:  An antifungal cream or ointment.  An antifungal shampoo.  Antifungal medicines. These may be prescribed if your ringworm is severe, keeps coming back, or lasts a long time. Follow these instructions at home:  Take over-the-counter and prescription medicines only as told by your health care provider.  If you were given an antifungal cream or ointment:  Use it as told by your health care provider.  Wash the infected area and dry it completely before applying the cream or ointment.  If you were given an antifungal shampoo:  Use it as told by your health care provider.  Leave the shampoo on your body for 3-5 minutes before rinsing.  While you have a rash:  Wear loose clothing to stop clothes from rubbing and irritating it.  Wash or change your bed sheets every night.  If your pet has the same infection, take your pet to see a Animal nutritionist. How is this prevented?  Practice good hygiene.  Wear sandals or shoes in public places and showers.  Do not share personal items with others.  Avoid touching red patches of skin on other people.  Avoid touching pets that have bald spots.  If you touch an animal that has a bald spot, wash your hands. Contact a health care provider if:  Your rash continues to spread after 7 days of treatment.  Your rash is not gone in 4 weeks.  The area around your rash gets red, warm, tender, and swollen. This information is not intended to replace advice given to you by your health care provider. Make sure you discuss any questions you have with your health care provider. Document Released: 11/18/2000 Document Revised: 04/28/2016 Document Reviewed: 09/17/2015  2017 Elsevier   The entirety of the information documented in the History of Present Illness, Review of Systems and Physical Exam were personally obtained by me. Portions of this  information were initially documented by [facility] ([place]) and reviewed by me for thoroughness and accuracy.           Trinna Post, PA-C  [facility]

## 2017-01-24 NOTE — Patient Instructions (Signed)
Body Ringworm Introduction Body ringworm is an infection of the skin that often causes a ring-shaped rash. Body ringworm can affect any part of your skin. It can spread easily to others. Body ringworm is also called tinea corporis. What are the causes? This condition is caused by funguses called dermatophytes. The condition develops when these funguses grow out of control on the skin. You can get this condition if you touch a person or animal that has it. You can also get it if you share clothing, bedding, towels, or any other object with an infected person or pet. What increases the risk? This condition is more likely to develop in:  Athletes who often make skin-to-skin contact with other athletes, such as wrestlers.  People who share equipment and mats.  People with a weakened immune system. What are the signs or symptoms? Symptoms of this condition include:  Itchy, raised red spots and bumps.  Red scaly patches.  A ring-shaped rash. The rash may have:  A clear center.  Scales or red bumps at its center.  Redness near its borders.  Dry and scaly skin on or around it. How is this diagnosed? This condition can usually be diagnosed with a skin exam. A skin scraping may be taken from the affected area and examined under a microscope to see if the fungus is present. How is this treated? This condition may be treated with:  An antifungal cream or ointment.  An antifungal shampoo.  Antifungal medicines. These may be prescribed if your ringworm is severe, keeps coming back, or lasts a long time. Follow these instructions at home:  Take over-the-counter and prescription medicines only as told by your health care provider.  If you were given an antifungal cream or ointment:  Use it as told by your health care provider.  Wash the infected area and dry it completely before applying the cream or ointment.  If you were given an antifungal shampoo:  Use it as told by your  health care provider.  Leave the shampoo on your body for 3-5 minutes before rinsing.  While you have a rash:  Wear loose clothing to stop clothes from rubbing and irritating it.  Wash or change your bed sheets every night.  If your pet has the same infection, take your pet to see a veterinarian. How is this prevented?  Practice good hygiene.  Wear sandals or shoes in public places and showers.  Do not share personal items with others.  Avoid touching red patches of skin on other people.  Avoid touching pets that have bald spots.  If you touch an animal that has a bald spot, wash your hands. Contact a health care provider if:  Your rash continues to spread after 7 days of treatment.  Your rash is not gone in 4 weeks.  The area around your rash gets red, warm, tender, and swollen. This information is not intended to replace advice given to you by your health care provider. Make sure you discuss any questions you have with your health care provider. Document Released: 11/18/2000 Document Revised: 04/28/2016 Document Reviewed: 09/17/2015  2017 Elsevier  

## 2017-01-31 ENCOUNTER — Ambulatory Visit (INDEPENDENT_AMBULATORY_CARE_PROVIDER_SITE_OTHER): Payer: BC Managed Care – PPO | Admitting: Family Medicine

## 2017-01-31 VITALS — BP 128/82 | HR 84 | Temp 98.7°F | Resp 14 | Wt 216.0 lb

## 2017-01-31 DIAGNOSIS — L03114 Cellulitis of left upper limb: Secondary | ICD-10-CM | POA: Diagnosis not present

## 2017-01-31 DIAGNOSIS — I10 Essential (primary) hypertension: Secondary | ICD-10-CM

## 2017-01-31 DIAGNOSIS — I639 Cerebral infarction, unspecified: Secondary | ICD-10-CM

## 2017-01-31 MED ORDER — AMOXICILLIN 500 MG PO CAPS: 500.0000 mg | ORAL_CAPSULE | Freq: Three times a day (TID) | ORAL | 0 refills | Status: DC

## 2017-01-31 NOTE — Progress Notes (Signed)
Nathaniel Gonzalez  MRN: [id number] DOB: [birth year]  Subjective:  HPI  Patient is here for follow up from January visit. Verapamil was started due to mor elevated b/p. He has not been checking it. BP Readings from Last 3 Encounters:  01/31/17 128/82  01/24/17 128/82  12/26/16 140/78    Also patient was seen on 01/24/17 for skin issue of the left hand and was treated as a ringworm. This has gotten worse. Left hand swelling present, redness worse, felt warm the other day. States he has had ring worm before but nothing like this. Patient Active Problem List   Diagnosis Date Noted  . Palpitations 07/25/2016  . 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 Right arm weakness 07/06/2016  . 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 Family history of colonic polyps 11/12/2008    Past Medical History:  Diagnosis Date  . Allergy   . Chicken pox   . Medical history non-contributory   . Stroke [facility])     Social History   Social History  . Marital status: Married    Spouse name: N/A  . Number of children: N/A  . Years of education: N/A   Occupational History  . Not on file.   Social History Main Topics  . Smoking status: Never Smoker  . Smokeless tobacco: Never Used  . Alcohol use No  . Drug use: No  . Sexual activity: Not on file   Other Topics Concern  . Not on file   Social History Narrative  . No narrative on file    Outpatient Encounter Prescriptions as of 01/31/2017  Medication Sig Note  . aspirin EC 325 MG EC tablet Take 1 tablet (325 mg total) by mouth daily.   Marland Kitchen atorvastatin (LIPITOR) 40 MG tablet Take 1 tablet (40 mg total) by mouth daily at 6 PM.   . Cholecalciferol (VITAMIN D-1000 MAX ST) 1000 units tablet Take by mouth. 09/16/2016: Received from: [facility]: Take by mouth.  . fluticasone (FLONASE) 50 MCG/ACT nasal spray Place 2 sprays into both nostrils daily as needed. Reported on 06/09/2016 09/16/2016: PRN  . terbinafine (LAMISIL)  1 % cream Apply 1 application topically 2 (two) times daily.   . verapamil (VERELAN PM) 180 MG 24 hr capsule Take 1 capsule (180 mg total) by mouth at bedtime.    No facility-administered encounter medications on file as of 01/31/2017.     Allergies  Allergen Reactions  . Erythromycin Hives    Review of Systems  Constitutional: Negative.   Respiratory: Negative.   Cardiovascular: Positive for palpitations.  Musculoskeletal: Negative.   Skin: Positive for rash.       Lesion of left hand    Objective:  BP 128/82   Pulse 84   Temp 98.7 F (37.1 C)   Resp 14   Wt 216 lb (98 kg)   BMI 30.99 kg/m   Physical Exam  Constitutional: He is oriented to person, place, and time and well-developed, well-nourished, and in no distress.  HENT:  Head: Normocephalic and atraumatic.  Right Ear: External ear normal.  Eyes: Conjunctivae are normal. Pupils are equal, round, and reactive to light.  Neck: Normal range of motion. Neck supple.  Cardiovascular: Normal rate, regular rhythm, normal heart sounds and intact distal pulses.   No murmur heard. Pulmonary/Chest: Effort normal and breath sounds normal. No respiratory distress. He has no wheezes.  Neurological: He is alert and oriented to person, place, and time.  Skin: Rash noted. There  is erythema.  Indurated but not fluctuant 3 x 3 1/2 across the back of the left hand, with bulls eye center.  Psychiatric: Mood, memory, affect and judgment normal.    Assessment and Plan :  1. Essential hypertension Better. Tolerating medication well. Continue current medication.  2. Cerebellar stroke (HCC  3. Cellulitis of left hand Will treat as cellulitis with Amoxil. Will also check for Lymes-this is unusual to see in this area but the presentation of the rash want to check this. Advised patient to not scrub the affected area but to just wash it with soap and water and wash it off. Can cover the area up for now until it starts to heal and look  better. If this does not improve will refer to dermatologist.  - CBC w/Diff/Platelet - Lyme Disease, IgM, Early Test w/ Rflx  HPI, Exam and A&P transcribed under direction and in the presence of Miguel Aschoff, MD. I have done the exam and reviewed the chart and it is accurate to the best of my knowledge. Development worker, community has been used and  any errors in dictation or transcription are unintentional. Miguel Aschoff M.D. [facility]

## 2017-02-01 LAB — CBC WITH DIFFERENTIAL/PLATELET
Basophils Absolute: 0 10*3/uL (ref 0.0–0.2)
Basos: 0 %
EOS (ABSOLUTE): 0.2 10*3/uL (ref 0.0–0.4)
Eos: 3 %
HEMOGLOBIN: 15.6 g/dL (ref 13.0–17.7)
Hematocrit: 43.9 % (ref 37.5–51.0)
IMMATURE GRANULOCYTES: 0 %
Immature Grans (Abs): 0 10*3/uL (ref 0.0–0.1)
LYMPHS ABS: 2.6 10*3/uL (ref 0.7–3.1)
Lymphs: 39 %
MCH: 31 pg (ref 26.6–33.0)
MCHC: 35.5 g/dL (ref 31.5–35.7)
MCV: 87 fL (ref 79–97)
Monocytes Absolute: 0.5 10*3/uL (ref 0.1–0.9)
Monocytes: 8 %
Neutrophils Absolute: 3.4 10*3/uL (ref 1.4–7.0)
Neutrophils: 50 %
Platelets: 229 10*3/uL (ref 150–379)
RBC: 5.03 x10E6/uL (ref 4.14–5.80)
RDW: 12.7 % (ref 12.3–15.4)
WBC: 6.8 10*3/uL (ref 3.4–10.8)

## 2017-02-01 LAB — LYME, IGM, EARLY TEST/REFLEX: LYME DISEASE AB, QUANT, IGM: <0.8 index (ref 0.00–0.79)

## 2017-04-27 DIAGNOSIS — E782 Mixed hyperlipidemia: Secondary | ICD-10-CM | POA: Insufficient documentation

## 2017-04-27 DIAGNOSIS — I10 Essential (primary) hypertension: Secondary | ICD-10-CM | POA: Insufficient documentation

## 2017-06-12 ENCOUNTER — Encounter: Payer: Self-pay | Admitting: Family Medicine

## 2017-06-12 ENCOUNTER — Ambulatory Visit (INDEPENDENT_AMBULATORY_CARE_PROVIDER_SITE_OTHER): Payer: BC Managed Care – PPO | Admitting: Family Medicine

## 2017-06-12 VITALS — BP 122/64 | HR 60 | Temp 97.9°F | Resp 16 | Ht 70.0 in | Wt 215.0 lb

## 2017-06-12 DIAGNOSIS — Z1211 Encounter for screening for malignant neoplasm of colon: Secondary | ICD-10-CM | POA: Diagnosis not present

## 2017-06-12 DIAGNOSIS — Z0000 Encounter for general adult medical examination without abnormal findings: Secondary | ICD-10-CM | POA: Diagnosis not present

## 2017-06-12 DIAGNOSIS — Z125 Encounter for screening for malignant neoplasm of prostate: Secondary | ICD-10-CM | POA: Diagnosis not present

## 2017-06-12 LAB — POCT URINALYSIS DIPSTICK
BILIRUBIN UA: NEGATIVE
Blood, UA: NEGATIVE
Glucose, UA: NEGATIVE
KETONES UA: NEGATIVE
LEUKOCYTES UA: NEGATIVE
Nitrite, UA: NEGATIVE
PROTEIN UA: NEGATIVE
SPEC GRAV UA: 1.02 (ref 1.010–1.025)
Urobilinogen, UA: 0.2 E.U./dL
pH, UA: 6 (ref 5.0–8.0)

## 2017-06-12 NOTE — Progress Notes (Signed)
Patient: Nathaniel Gonzalez, Male    DOB: 1966/05/14, 51 y.o.   MRN: [id number] Visit Date: 06/12/2017  Today's Provider: Wilhemena Durie, MD   Chief Complaint  Patient presents with  . Annual Exam   Subjective:    Annual physical exam Nathaniel Gonzalez is a 51 y.o. male who presents today for health maintenance and complete physical. He feels well. He reports exercising about 3 times a week (couching). He reports he is sleeping well. -----------------------------------------------------------------  Immunization History  Administered Date(s) Administered  . Tdap 11/12/2008     Review of Systems  Constitutional: Negative.   HENT: Positive for congestion and sneezing.   Eyes: Negative.   Respiratory: Positive for cough.   Cardiovascular: Negative.   Gastrointestinal: Negative.   Endocrine: Negative.   Genitourinary: Negative.   Musculoskeletal: Negative.   Skin: Negative.   Allergic/Immunologic: Positive for environmental allergies and food allergies.  Neurological: Positive for numbness.  Hematological: Negative.   Psychiatric/Behavioral: Negative.     Social History      He  reports that he has never smoked. He has never used smokeless tobacco. He reports that he does not drink alcohol or use drugs.       Social History   Social History  . Marital status: Married    Spouse name: N/A  . Number of children: N/A  . Years of education: N/A   Social History Main Topics  . Smoking status: Never Smoker  . Smokeless tobacco: Never Used  . Alcohol use No  . Drug use: No  . Sexual activity: Not Asked   Other Topics Concern  . None   Social History Narrative  . None    Past Medical History:  Diagnosis Date  . Allergy   . Chicken pox   . Medical history non-contributory   . Stroke [facility])      Patient Active Problem List   Diagnosis Date Noted  . Palpitations 07/25/2016  . 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 Right arm weakness  07/06/2016  . 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 Family history of colonic polyps 11/12/2008    Past Surgical History:  Procedure Laterality Date  . COLONOSCOPY  2011  . MOUTH SURGERY    . SKIN LESION EXCISION     several, he goes yearly  . TEE WITHOUT CARDIOVERSION N/A 08/17/2016   Procedure: TRANSESOPHAGEAL ECHOCARDIOGRAM (TEE);  Surgeon: Corey Skains, MD;  Location: [facility] ORS;  Service: Cardiovascular;  Laterality: N/A;    Family History        Family Status  Relation Status  . Mother Deceased  . Father Alive  . Brother Alive  . Brother Alive  . Daughter Alive  . Son Alive  . Mat Aunt Deceased  . MGM Deceased  . MGF Deceased  . PGM Deceased  . PGF Deceased  . Cousin Alive        His family history includes Aneurysm in his father; COPD in his father; Cancer in his cousin, maternal aunt, maternal grandmother, mother, and paternal grandmother; Colon polyps in his father and mother; Heart attack in his father; Heart disease in his father, maternal grandmother, and paternal grandfather; Hypertension in his brother; Skin cancer in his mother.     Allergies  Allergen Reactions  . Erythromycin Hives     Current Outpatient Prescriptions:  .  aspirin EC 325 MG EC tablet, Take 1 tablet (325 mg total) by mouth daily., Disp: 30 tablet, Rfl: 0 .  atorvastatin (LIPITOR) 40 MG tablet, Take 1 tablet (40 mg total) by mouth daily at 6 PM., Disp: 30 tablet, Rfl: 12 .  Cholecalciferol (VITAMIN D-1000 MAX ST) 1000 units tablet, Take by mouth., Disp: , Rfl:  .  verapamil (VERELAN PM) 180 MG 24 hr capsule, Take 1 capsule (180 mg total) by mouth at bedtime., Disp: 30 capsule, Rfl: 12 .  fluticasone (FLONASE) 50 MCG/ACT nasal spray, Place 2 sprays into both nostrils daily as needed. Reported on 06/09/2016, Disp: , Rfl:  .  terbinafine (LAMISIL) 1 % cream, Apply 1 application topically 2 (two) times daily. (Patient not taking: Reported on 06/12/2017), Disp: 30 g, Rfl: 0   Patient Care  Team: Jerrol Banana., MD as PCP - General (Family Medicine) Vladimir Crofts, MD as Consulting Physician (Neurology) Corey Skains, MD as Consulting Physician (Cardiology)      Objective:   Vitals: BP 122/64 (BP Location: Left Arm, Patient Position: Sitting, Cuff Size: Normal)   Pulse 60   Temp 97.9 F (36.6 C) (Oral)   Resp 16   Ht 5\' 10"  (1.778 m)   Wt 215 lb (97.5 kg)   BMI 30.85 kg/m    Vitals:   06/12/17 0915  BP: 122/64  Pulse: 60  Resp: 16  Temp: 97.9 F (36.6 C)  TempSrc: Oral  Weight: 215 lb (97.5 kg)  Height: 5\' 10"  (1.778 m)     Physical Exam  Constitutional: He is oriented to person, place, and time. He appears well-developed and well-nourished.  HENT:  Head: Normocephalic and atraumatic.  Right Ear: External ear normal.  Left Ear: External ear normal.  Nose: Nose normal.  Mouth/Throat: Oropharynx is clear and moist.  Eyes: Conjunctivae and EOM are normal. Pupils are equal, round, and reactive to light.  Neck: Normal range of motion. Neck supple.  Cardiovascular: Normal rate, regular rhythm, normal heart sounds and intact distal pulses.   Pulmonary/Chest: Effort normal and breath sounds normal.  Abdominal: Soft. Bowel sounds are normal.  Genitourinary: Rectum normal, prostate normal and penis normal.  Musculoskeletal: Normal range of motion.  Neurological: He is alert and oriented to person, place, and time. He has normal reflexes.  Mild decreased sensation right hand.  Skin: Skin is warm and dry.  Psychiatric: He has a normal mood and affect. His behavior is normal. Judgment and thought content normal.     Depression Screen PHQ 2/9 Scores 06/12/2017 01/31/2017  PHQ - 2 Score 0 0  PHQ- 9 Score 0 2      Assessment & Plan:     Routine Health Maintenance and Physical Exam  Exercise Activities and Dietary recommendations Goals    None      Immunization History  Administered Date(s) Administered  . Tdap 11/12/2008    Health  Maintenance  Topic Date Due  . INFLUENZA VACCINE  07/05/2017  . TETANUS/TDAP  11/12/2018  . HIV Screening  Completed    F/u colonoscopy Dr Vicente Males. Discussed health benefits of physical activity, and encouraged him to engage in regular exercise appropriate for his age and condition.    --------------------------------------------------------------------    Wilhemena Durie, MD  [place]

## 2017-06-22 ENCOUNTER — Encounter: Payer: Self-pay | Admitting: Hematology and Oncology

## 2017-06-30 LAB — LIPID PANEL WITH LDL/HDL RATIO
CHOLESTEROL TOTAL: 129 mg/dL (ref 100–199)
HDL: 39 mg/dL — ABNORMAL LOW (ref >39)
LDL CALC: 63 mg/dL (ref 0–99)
LDl/HDL Ratio: 1.6 ratio (ref 0.0–3.6)
Triglycerides: 133 mg/dL (ref 0–149)
VLDL CHOLESTEROL CAL: 27 mg/dL (ref 5–40)

## 2017-06-30 LAB — CBC WITH DIFFERENTIAL/PLATELET
BASOS ABS: 0 10*3/uL (ref 0.0–0.2)
Basos: 1 %
EOS (ABSOLUTE): 0.3 10*3/uL (ref 0.0–0.4)
EOS: 6 %
HEMATOCRIT: 45 % (ref 37.5–51.0)
Hemoglobin: 16.2 g/dL (ref 13.0–17.7)
IMMATURE GRANULOCYTES: 0 %
Immature Grans (Abs): 0 10*3/uL (ref 0.0–0.1)
Lymphocytes Absolute: 2.1 10*3/uL (ref 0.7–3.1)
Lymphs: 35 %
MCH: 30.7 pg (ref 26.6–33.0)
MCHC: 36 g/dL — ABNORMAL HIGH (ref 31.5–35.7)
MCV: 85 fL (ref 79–97)
MONOCYTES: 6 %
MONOS ABS: 0.4 10*3/uL (ref 0.1–0.9)
Neutrophils Absolute: 3.1 10*3/uL (ref 1.4–7.0)
Neutrophils: 52 %
Platelets: 227 10*3/uL (ref 150–379)
RBC: 5.27 x10E6/uL (ref 4.14–5.80)
RDW: 13.5 % (ref 12.3–15.4)
WBC: 6 10*3/uL (ref 3.4–10.8)

## 2017-06-30 LAB — PSA: PROSTATE SPECIFIC AG, SERUM: 1.1 ng/mL (ref 0.0–4.0)

## 2017-06-30 LAB — COMPREHENSIVE METABOLIC PANEL
A/G RATIO: 1.7 (ref 1.2–2.2)
ALBUMIN: 4.3 g/dL (ref 3.5–5.5)
ALK PHOS: 79 IU/L (ref 39–117)
ALT: 36 IU/L (ref 0–44)
AST: 26 IU/L (ref 0–40)
BILIRUBIN TOTAL: 0.9 mg/dL (ref 0.0–1.2)
BUN / CREAT RATIO: 13 (ref 9–20)
BUN: 14 mg/dL (ref 6–24)
CO2: 22 mmol/L (ref 20–29)
CREATININE: 1.04 mg/dL (ref 0.76–1.27)
Calcium: 9.3 mg/dL (ref 8.7–10.2)
Chloride: 105 mmol/L (ref 96–106)
GFR calc Af Amer: 97 mL/min/{1.73_m2} (ref >59)
GFR calc non Af Amer: 84 mL/min/{1.73_m2} (ref >59)
GLOBULIN, TOTAL: 2.6 g/dL (ref 1.5–4.5)
GLUCOSE: 94 mg/dL (ref 65–99)
Potassium: 4.2 mmol/L (ref 3.5–5.2)
SODIUM: 139 mmol/L (ref 134–144)
Total Protein: 6.9 g/dL (ref 6.0–8.5)

## 2017-06-30 LAB — TSH: TSH: 1.66 u[IU]/mL (ref 0.450–4.500)

## 2017-07-27 ENCOUNTER — Other Ambulatory Visit: Payer: Self-pay | Admitting: Family Medicine

## 2017-07-27 DIAGNOSIS — I639 Cerebral infarction, unspecified: Secondary | ICD-10-CM

## 2017-11-30 ENCOUNTER — Ambulatory Visit
Admission: RE | Admit: 2017-11-30 | Discharge: 2017-11-30 | Disposition: A | Discharge status: Home / Self Care | Payer: BC Managed Care – PPO | Source: Ambulatory Visit | Attending: Family Medicine | Admitting: Family Medicine

## 2017-11-30 ENCOUNTER — Ambulatory Visit: Payer: BC Managed Care – PPO | Admitting: Family Medicine

## 2017-11-30 VITALS — BP 132/80 | HR 86 | Temp 97.8°F | Resp 16 | Wt 220.4 lb

## 2017-11-30 DIAGNOSIS — M545 Low back pain, unspecified: Secondary | ICD-10-CM

## 2017-11-30 DIAGNOSIS — J069 Acute upper respiratory infection, unspecified: Secondary | ICD-10-CM | POA: Diagnosis not present

## 2017-11-30 DIAGNOSIS — E782 Mixed hyperlipidemia: Secondary | ICD-10-CM

## 2017-11-30 DIAGNOSIS — M549 Dorsalgia, unspecified: Secondary | ICD-10-CM | POA: Diagnosis present

## 2017-11-30 DIAGNOSIS — Z2821 Immunization not carried out because of patient refusal: Secondary | ICD-10-CM

## 2017-11-30 DIAGNOSIS — I10 Essential (primary) hypertension: Secondary | ICD-10-CM | POA: Diagnosis not present

## 2017-11-30 DIAGNOSIS — Z8673 Personal history of transient ischemic attack (TIA), and cerebral infarction without residual deficits: Secondary | ICD-10-CM | POA: Diagnosis not present

## 2017-11-30 DIAGNOSIS — I63019 Cerebral infarction due to thrombosis of unspecified vertebral artery: Secondary | ICD-10-CM | POA: Diagnosis not present

## 2017-11-30 NOTE — Progress Notes (Signed)
Patient: Nathaniel Gonzalez Male    DOB: January 24, 1966   51 y.o.   MRN: [id number] Visit Date: 11/30/2017  Today's Provider: Wilhemena Durie, MD   Chief Complaint  Patient presents with  . Hypertension  . Hyperlipidemia  . Cough   Subjective:    HPI  Patient is here for for follow up. LOV was 06/12/17 for CPE. Last lab work was done on 06/29/17-routine.  Hypertension: patient is not checking his b/p but did get a b/p monitor and will start doing this. No cardiac symptoms. BP Readings from Last 3 Encounters:  11/30/17 132/80  06/12/17 122/64  01/31/17 128/82   Wt Readings from Last 3 Encounters:  11/30/17 220 lb 6.4 oz (100 kg)  06/12/17 215 lb (97.5 kg)  01/31/17 216 lb (98 kg)   Patient also not feeling well. Symptoms started on 11/19/17. Symptoms are sinus pain "eye pain with movement", sore throat-dry, cough, chest congestion, head congestion, drainage. Patient has not taking anything for the symptoms, he is not sure what he can take with his health history. Overall a little better compared to when this all started.    Allergies  Allergen Reactions  . Erythromycin Hives     Current Outpatient Medications:  .  aspirin EC 325 MG EC tablet, Take 1 tablet (325 mg total) by mouth daily., Disp: 30 tablet, Rfl: 0 .  atorvastatin (LIPITOR) 40 MG tablet, TAKE 1 TABLET (40 MG TOTAL) BY MOUTH DAILY AT 6 PM., Disp: 90 tablet, Rfl: 3 .  Cholecalciferol (VITAMIN D-1000 MAX ST) 1000 units tablet, Take by mouth., Disp: , Rfl:  .  verapamil (VERELAN PM) 180 MG 24 hr capsule, Take 1 capsule (180 mg total) by mouth at bedtime., Disp: 30 capsule, Rfl: 12 .  fluticasone (FLONASE) 50 MCG/ACT nasal spray, Place 2 sprays into both nostrils daily as needed. Reported on 06/09/2016, Disp: , Rfl:   Review of Systems  Constitutional: Positive for activity change, appetite change and fatigue. Negative for chills.  HENT: Positive for congestion, postnasal drip, rhinorrhea, sinus pressure,  sinus pain, sneezing, sore throat and voice change.   Respiratory: Positive for cough, choking, chest tightness, shortness of breath and wheezing. Negative for apnea.   Cardiovascular: Positive for palpitations (chronic per patient.).  Gastrointestinal: Negative for abdominal pain, constipation, diarrhea, nausea and vomiting.  Musculoskeletal: Positive for arthralgias (side of bilateral legs sore a lot) and back pain (lower back, bilateral).  Neurological: Positive for numbness (right hand-since 07/2016. post stroke.) and headaches. Negative for dizziness, weakness and light-headedness.    Social History   Tobacco Use  . Smoking status: Never Smoker  . Smokeless tobacco: Never Used  Substance Use Topics  . Alcohol use: No   Objective:   BP 132/80   Pulse 86   Temp 97.8 F (36.6 C)   Resp 16   Wt 220 lb 6.4 oz (100 kg)   SpO2 97%   BMI 31.62 kg/m  Vitals:   11/30/17 0819  BP: 132/80  Pulse: 86  Resp: 16  Temp: 97.8 F (36.6 C)  SpO2: 97%  Weight: 220 lb 6.4 oz (100 kg)     Physical Exam  Constitutional: He is oriented to person, place, and time. He appears well-developed and well-nourished.  HENT:  Head: Normocephalic and atraumatic.  Eyes: Conjunctivae are normal. No scleral icterus.  Neck: No thyromegaly present.  Cardiovascular: Normal rate, regular rhythm and normal heart sounds.  Pulmonary/Chest: Effort normal.  Abdominal: Soft.  Musculoskeletal: He exhibits no edema.  Lymphadenopathy:    He has no cervical adenopathy.  Neurological: He is alert and oriented to person, place, and time. No cranial nerve deficit. He exhibits normal muscle tone. Coordination normal.  Grossly nonfocal.  Skin: Skin is warm and dry.  Psychiatric: He has a normal mood and affect. His behavior is normal. Judgment and thought content normal.        Assessment & Plan:     1. Essential hypertension Stable.  2. Mixed hyperlipidemia 3. Influenza vaccination declined by  patient  4. Upper respiratory tract infection, unspecified type Viral. Try Robitussin DM OTC> follow as needed.  5. Acute bilateral low back pain without sciatica Pending results. - Urinalysis, Complete - DG Lumbar Spine Complete; Future  6. Status post CVA  HPI, Exam and A&P transcribed by Tiffany Kocher, RMA under direction and in the presence of Miguel Aschoff, MD. I have done the exam and reviewed the chart and it is accurate to the best of my knowledge. Development worker, community has been used and  any errors in dictation or transcription are unintentional. Miguel Aschoff M.D. [place], MD  [facility]

## 2017-11-30 NOTE — Patient Instructions (Signed)
Try Robitussin DM over the counter for the cough and congestion.

## 2017-12-06 ENCOUNTER — Other Ambulatory Visit: Payer: Self-pay | Admitting: Family Medicine

## 2017-12-07 LAB — MICROSCOPIC EXAMINATION
BACTERIA UA: NONE SEEN
CASTS: NONE SEEN /LPF

## 2017-12-07 LAB — URINALYSIS, COMPLETE
BILIRUBIN UA: NEGATIVE
Glucose, UA: NEGATIVE
Ketones, UA: NEGATIVE
LEUKOCYTES UA: NEGATIVE
Nitrite, UA: NEGATIVE
PH UA: 6 (ref 5.0–7.5)
PROTEIN UA: NEGATIVE
RBC UA: NEGATIVE
Specific Gravity, UA: 1.028 (ref 1.005–1.030)
UUROB: 0.2 mg/dL (ref 0.2–1.0)

## 2018-01-01 ENCOUNTER — Other Ambulatory Visit: Payer: Self-pay

## 2018-01-01 DIAGNOSIS — I10 Essential (primary) hypertension: Secondary | ICD-10-CM

## 2018-01-01 DIAGNOSIS — I639 Cerebral infarction, unspecified: Secondary | ICD-10-CM

## 2018-01-01 MED ORDER — VERAPAMIL HCL ER 180 MG PO CP24: 180.0000 mg | ORAL_CAPSULE | Freq: Every day | ORAL | 3 refills | Status: DC

## 2018-01-02 ENCOUNTER — Encounter: Payer: Self-pay | Admitting: Family Medicine

## 2018-05-04 ENCOUNTER — Ambulatory Visit: Payer: BC Managed Care – PPO | Attending: Neurology

## 2018-05-04 DIAGNOSIS — R400 Somnolence: Secondary | ICD-10-CM | POA: Insufficient documentation

## 2018-05-04 DIAGNOSIS — G4733 Obstructive sleep apnea (adult) (pediatric): Secondary | ICD-10-CM | POA: Insufficient documentation

## 2018-06-13 ENCOUNTER — Ambulatory Visit (INDEPENDENT_AMBULATORY_CARE_PROVIDER_SITE_OTHER): Payer: BC Managed Care – PPO | Admitting: Family Medicine

## 2018-06-13 ENCOUNTER — Encounter: Payer: Self-pay | Admitting: Family Medicine

## 2018-06-13 VITALS — BP 122/80 | HR 84 | Temp 98.6°F | Resp 16 | Wt 221.0 lb

## 2018-06-13 DIAGNOSIS — Z0000 Encounter for general adult medical examination without abnormal findings: Secondary | ICD-10-CM

## 2018-06-13 DIAGNOSIS — Z125 Encounter for screening for malignant neoplasm of prostate: Secondary | ICD-10-CM

## 2018-06-13 DIAGNOSIS — Z1211 Encounter for screening for malignant neoplasm of colon: Secondary | ICD-10-CM | POA: Diagnosis not present

## 2018-06-13 NOTE — Progress Notes (Signed)
Patient: Nathaniel Gonzalez, Male    DOB: 1966-09-20, 52 y.o.   MRN: [id number] Visit Date: 06/13/2018  Today's Provider: Wilhemena Durie, MD   Chief Complaint  Patient presents with  . Annual Exam   Subjective:    Annual physical exam Nathaniel Gonzalez is a 52 y.o. male who presents today for health maintenance and complete physical. He feels well. He reports exercising occasionally, but he does stay active. He reports he is sleeping well.  Colonoscopy- 01/29/2010. Normal. FH of colon polyps.  Tdap- 11/12/2008.     Review of Systems  Constitutional: Negative.   HENT: Positive for congestion, postnasal drip, rhinorrhea, sinus pressure and sneezing.   Eyes: Negative.   Respiratory: Positive for cough.   Cardiovascular: Negative.   Gastrointestinal: Negative.   Endocrine: Negative.   Genitourinary: Negative.   Musculoskeletal: Negative.   Skin: Negative.   Allergic/Immunologic: Positive for environmental allergies.  Neurological: Negative.   Hematological: Negative.   Psychiatric/Behavioral: Negative.     Social History      He  reports that he has never smoked. He has never used smokeless tobacco. He reports that he does not drink alcohol or use drugs.       Social History   Socioeconomic History  . Marital status: Married    Spouse name: Not on file  . Number of children: Not on file  . Years of education: Not on file  . Highest education level: Not on file  Occupational History  . Not on file  Social Needs  . Financial resource strain: Not on file  . Food insecurity:    Worry: Not on file    Inability: Not on file  . Transportation needs:    Medical: Not on file    Non-medical: Not on file  Tobacco Use  . Smoking status: Never Smoker  . Smokeless tobacco: Never Used  Substance and Sexual Activity  . Alcohol use: No  . Drug use: No  . Sexual activity: Not on file  Lifestyle  . Physical activity:    Days per week: Not on file   Minutes per session: Not on file  . Stress: Not on file  Relationships  . Social connections:    Talks on phone: Not on file    Gets together: Not on file    Attends religious service: Not on file    Active member of club or organization: Not on file    Attends meetings of clubs or organizations: Not on file    Relationship status: Not on file  Other Topics Concern  . Not on file  Social History Narrative  . Not on file    Past Medical History:  Diagnosis Date  . Allergy   . Chicken pox   . Medical history non-contributory   . Stroke [facility])      Patient Active Problem List   Diagnosis Date Noted  . Benign essential HTN 04/27/2017  . Hyperlipidemia, mixed 04/27/2017  . Palpitations 07/25/2016  . 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 Family history of colonic polyps 11/12/2008    Past Surgical History:  Procedure Laterality Date  . COLONOSCOPY  2011  . MOUTH SURGERY    . SKIN LESION EXCISION     several, he goes yearly  . TEE WITHOUT CARDIOVERSION N/A 08/17/2016   Procedure: TRANSESOPHAGEAL ECHOCARDIOGRAM (TEE);  Surgeon: Corey Skains, MD;  Location: [facility] ORS;  Service: Cardiovascular;  Laterality:  N/A;    Family History        Family Status  Relation Name Status  . Mother  Deceased  . Father  Alive  . Brother  Alive  . Brother  Alive  . Daughter  Alive  . Son  Alive  . Mat Aunt  Deceased  . MGM  Deceased  . MGF  Deceased  . PGM  Deceased  . PGF  Deceased  . Cousin  Alive        His family history includes Aneurysm in his father; COPD in his father; Cancer in his cousin, maternal aunt, maternal grandmother, mother, and paternal grandmother; Colon polyps in his father and mother; Heart attack in his father; Heart disease in his father, maternal grandmother, and paternal grandfather; Hypertension in his brother; Skin cancer in his mother.      Allergies  Allergen Reactions  . Erythromycin Hives     Current  Outpatient Medications:  .  aspirin EC 325 MG EC tablet, Take 1 tablet (325 mg total) by mouth daily., Disp: 30 tablet, Rfl: 0 .  atorvastatin (LIPITOR) 40 MG tablet, TAKE 1 TABLET (40 MG TOTAL) BY MOUTH DAILY AT 6 PM., Disp: 90 tablet, Rfl: 3 .  cetirizine (ZYRTEC) 10 MG tablet, Take 10 mg by mouth daily., Disp: , Rfl:  .  Cholecalciferol (VITAMIN D-1000 MAX ST) 1000 units tablet, Take by mouth., Disp: , Rfl:  .  verapamil (VERELAN PM) 180 MG 24 hr capsule, Take 1 capsule (180 mg total) by mouth at bedtime., Disp: 90 capsule, Rfl: 3 .  fluticasone (FLONASE) 50 MCG/ACT nasal spray, Place 2 sprays into both nostrils daily as needed. Reported on 06/09/2016, Disp: , Rfl:    Patient Care Team: Jerrol Banana., MD as PCP - General (Family Medicine) Vladimir Crofts, MD as Consulting Physician (Neurology) Corey Skains, MD as Consulting Physician (Cardiology)      Objective:   Vitals: BP 122/80 (BP Location: Right Arm, Patient Position: Sitting, Cuff Size: Normal)   Pulse 84   Temp 98.6 F (37 C)   Resp 16   Wt 221 lb (100.2 kg)   SpO2 96%   BMI 31.71 kg/m    Vitals:   06/13/18 0950  BP: 122/80  Pulse: 84  Resp: 16  Temp: 98.6 F (37 C)  SpO2: 96%  Weight: 221 lb (100.2 kg)     Physical Exam  Constitutional: He is oriented to person, place, and time. He appears well-developed and well-nourished.  HENT:  Head: Normocephalic and atraumatic.  Right Ear: External ear normal.  Left Ear: External ear normal.  Nose: Nose normal.  Eyes: Conjunctivae are normal. No scleral icterus.  Neck: No thyromegaly present.  Cardiovascular: Normal rate, regular rhythm and normal heart sounds.  Pulmonary/Chest: Effort normal and breath sounds normal.  Abdominal: Soft.  Neurological: He is alert and oriented to person, place, and time.  Skin: Skin is warm and dry.  Psychiatric: He has a normal mood and affect. His behavior is normal. Judgment and thought content normal.      Depression Screen PHQ 2/9 Scores 06/13/2018 06/12/2017 01/31/2017  PHQ - 2 Score 1 0 0  PHQ- 9 Score 2 0 2      Assessment & Plan:     Routine Health Maintenance and Physical Exam  Exercise Activities and Dietary recommendations Goals    None      Immunization History  Administered Date(s) Administered  . Tdap 11/12/2008    Health  Maintenance  Topic Date Due  . INFLUENZA VACCINE  07/05/2018  . TETANUS/TDAP  11/12/2018  . COLONOSCOPY  01/30/2020  . HIV Screening  Completed    Refer for screening colonoscopy. Discussed health benefits of physical activity, and encouraged him to engage in regular exercise appropriate for his age and condition.  Possible OSA Per Dr Manuella Ghazi. Atypical Nevus-refer to Derm.   --------------------------------------------------------------------   I have done the exam and reviewed the above chart and it is accurate to the best of my knowledge. Development worker, community has been used in this note in any air is in the dictation or transcription are unintentional.  Wilhemena Durie, MD  [place]

## 2018-06-28 ENCOUNTER — Other Ambulatory Visit: Payer: Self-pay

## 2018-06-28 ENCOUNTER — Encounter: Payer: Self-pay | Admitting: Gastroenterology

## 2018-06-28 ENCOUNTER — Ambulatory Visit: Payer: BC Managed Care – PPO | Admitting: Gastroenterology

## 2018-06-28 VITALS — BP 139/77 | HR 85 | Ht 70.0 in | Wt 221.0 lb

## 2018-06-28 DIAGNOSIS — Z8371 Family history of colonic polyps: Secondary | ICD-10-CM

## 2018-06-28 DIAGNOSIS — R1310 Dysphagia, unspecified: Secondary | ICD-10-CM

## 2018-06-28 NOTE — Progress Notes (Signed)
[name] MD, MRCP(U.K) [address]  [place]  [place], [place] [address]  Main: [phone]  Fax: [phone]   Gastroenterology Consultation  Referring Provider:     Jerrol Banana.,* Primary Care Physician:  Jerrol Banana., MD Primary Gastroenterologist:  Dr. [name]  Reason for Consultation:     Dysphagia , colonoscopy         HPI:   Nathaniel Gonzalez is a 52 y.o. y/o male referred for consultation & management  by Dr. Rosanna Randy, Retia Passe., MD.   He has been referred for dysphagia and  Colonoscopy . F/h of colon polyps. Last colonoscopy in 2011 was normal.    Dysphagia: Onset and any progression: 5-8 years after his last colonoscopy noticed that certain foods like rice, roast beef , por didn't go down , fish easier. Certain breads  Frequency: everytime he eats rice , points to the epigastrium and would come back up , no dry mouth , no coughing while eating . He had learnt to stretch his body in a way to get the food down. He says that he had a TEE  1 year , since has not had any major issues, presently only rice affects him  Foods affected : as above  Prior episodes of impaction: none  History of asthma/allergy : alergies  History of heartburn/Reflux : no  Weight loss/weight gain : gained  Prior EGD: no  PPI/H2 blocker use : no   Normal bowel movements.   Past Medical History:  Diagnosis Date  . Allergy   . Chicken pox   . Medical history non-contributory   . Stroke [facility])     Past Surgical History:  Procedure Laterality Date  . COLONOSCOPY  2011  . MOUTH SURGERY    . SKIN LESION EXCISION     several, he goes yearly  . TEE WITHOUT CARDIOVERSION N/A 08/17/2016   Procedure: TRANSESOPHAGEAL ECHOCARDIOGRAM (TEE);  Surgeon: Corey Skains, MD;  Location: [facility] ORS;  Service: Cardiovascular;  Laterality: N/A;    Prior to Admission medications   Medication Sig Start Date End Date Taking? Authorizing Provider  aspirin EC 325 MG EC  tablet Take 1 tablet (325 mg total) by mouth daily. 07/08/16   Max Sane, MD  atorvastatin (LIPITOR) 40 MG tablet TAKE 1 TABLET (40 MG TOTAL) BY MOUTH DAILY AT 6 PM. 07/27/17   Jerrol Banana., MD  cetirizine (ZYRTEC) 10 MG tablet Take 10 mg by mouth daily.    [provider]  Cholecalciferol (VITAMIN D-1000 MAX ST) 1000 units tablet Take by mouth.    [provider]  fluticasone (FLONASE) 50 MCG/ACT nasal spray Place 2 sprays into both nostrils daily as needed. Reported on 06/09/2016    [provider]  verapamil (VERELAN PM) 180 MG 24 hr capsule Take 1 capsule (180 mg total) by mouth at bedtime. 01/01/18   Jerrol Banana., MD    Family History  Problem Relation Age of Onset  . Skin cancer Mother   . Colon polyps Mother   . Cancer Mother        skin, ureter cancer  . Heart attack Father   . COPD Father   . Aneurysm Father   . Colon polyps Father   . Heart disease Father   . Hypertension Brother   . Cancer Maternal Aunt        skin  . Cancer Maternal Grandmother  unknown type  . Heart disease Maternal Grandmother   . Cancer Paternal Grandmother        liver  . Heart disease Paternal Grandfather        died age 22 MI  . Cancer Cousin        skin     Social History   Tobacco Use  . Smoking status: Never Smoker  . Smokeless tobacco: Never Used  Substance Use Topics  . Alcohol use: No  . Drug use: No    Allergies as of 06/28/2018 - Review Complete 06/13/2018  Allergen Reaction Noted  . Erythromycin Hives 03/28/2016    Review of Systems:    All systems reviewed and negative except where noted in HPI.   Physical Exam:  There were no vitals taken for this visit. No LMP for male patient. Psych:  Alert and cooperative. Normal mood and affect. General:   Alert,  Well-developed, well-nourished, pleasant and cooperative in NAD Head:  Normocephalic and atraumatic. Eyes:  Sclera clear, no icterus.   Conjunctiva pink. Ears:  Normal  auditory acuity. Nose:  No deformity, discharge, or lesions. Mouth:  No deformity or lesions,oropharynx pink & moist. Neck:  Supple; no masses or thyromegaly. Lungs:  Respirations even and unlabored.  Clear throughout to auscultation.   No wheezes, crackles, or rhonchi. No acute distress. Heart:  Regular rate and rhythm; no murmurs, clicks, rubs, or gallops. Abdomen:  Normal bowel sounds.  No bruits.  Soft, non-tender and non-distended without masses, hepatosplenomegaly or hernias noted.  No guarding or rebound tenderness.    Neurologic:  Alert and oriented x3;  grossly normal neurologically. Skin:  Intact without significant lesions or rashes. No jaundice. Lymph Nodes:  No significant cervical adenopathy. Psych:  Alert and cooperative. Normal mood and affect.  Imaging Studies: No results found.  Assessment and Plan:   Nathaniel Gonzalez is a 52 y.o. y/o male has been referred for dysphagia and a screening colonoscopy (high risk family history of colon polyps). 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 EGD+/- dilation +colonoscopy  2. Trial of Zantac OTC   I have discussed alternative options, risks & benefits,  which include, but are not limited to, bleeding, infection, perforation,respiratory complication & drug reaction.  The patient agrees with this plan & written consent will be obtained.     Follow up in 8 weeks   Dr [name] MD,MRCP(U.K)

## 2018-07-18 ENCOUNTER — Encounter: Payer: Self-pay | Admitting: Emergency Medicine

## 2018-07-18 LAB — COMPREHENSIVE METABOLIC PANEL
ALK PHOS: 83 IU/L (ref 39–117)
ALT: 31 IU/L (ref 0–44)
AST: 20 IU/L (ref 0–40)
Albumin/Globulin Ratio: 1.6 (ref 1.2–2.2)
Albumin: 4.1 g/dL (ref 3.5–5.5)
BILIRUBIN TOTAL: 1.5 mg/dL — AB (ref 0.0–1.2)
BUN/Creatinine Ratio: 14 (ref 9–20)
BUN: 16 mg/dL (ref 6–24)
CHLORIDE: 106 mmol/L (ref 96–106)
CO2: 22 mmol/L (ref 20–29)
Calcium: 9.5 mg/dL (ref 8.7–10.2)
Creatinine, Ser: 1.16 mg/dL (ref 0.76–1.27)
GFR calc Af Amer: 84 mL/min/{1.73_m2} (ref >59)
GFR calc non Af Amer: 73 mL/min/{1.73_m2} (ref >59)
Globulin, Total: 2.6 g/dL (ref 1.5–4.5)
Glucose: 94 mg/dL (ref 65–99)
Potassium: 4.1 mmol/L (ref 3.5–5.2)
Sodium: 142 mmol/L (ref 134–144)
Total Protein: 6.7 g/dL (ref 6.0–8.5)

## 2018-07-18 LAB — LIPID PANEL WITH LDL/HDL RATIO
CHOLESTEROL TOTAL: 134 mg/dL (ref 100–199)
HDL: 33 mg/dL — AB (ref >39)
LDL Calculated: 72 mg/dL (ref 0–99)
LDl/HDL Ratio: 2.2 ratio (ref 0.0–3.6)
TRIGLYCERIDES: 143 mg/dL (ref 0–149)
VLDL CHOLESTEROL CAL: 29 mg/dL (ref 5–40)

## 2018-07-18 LAB — TSH: TSH: 1.61 u[IU]/mL (ref 0.450–4.500)

## 2018-07-18 LAB — CBC WITH DIFFERENTIAL/PLATELET
BASOS ABS: 0 10*3/uL (ref 0.0–0.2)
Basos: 1 %
EOS (ABSOLUTE): 0.2 10*3/uL (ref 0.0–0.4)
Eos: 3 %
Hematocrit: 44.5 % (ref 37.5–51.0)
Hemoglobin: 15.2 g/dL (ref 13.0–17.7)
IMMATURE GRANS (ABS): 0 10*3/uL (ref 0.0–0.1)
Immature Granulocytes: 0 %
LYMPHS: 38 %
Lymphocytes Absolute: 1.9 10*3/uL (ref 0.7–3.1)
MCH: 30.1 pg (ref 26.6–33.0)
MCHC: 34.2 g/dL (ref 31.5–35.7)
MCV: 88 fL (ref 79–97)
Monocytes Absolute: 0.4 10*3/uL (ref 0.1–0.9)
Monocytes: 8 %
NEUTROS ABS: 2.5 10*3/uL (ref 1.4–7.0)
Neutrophils: 50 %
PLATELETS: 244 10*3/uL (ref 150–450)
RBC: 5.05 x10E6/uL (ref 4.14–5.80)
RDW: 13.1 % (ref 12.3–15.4)
WBC: 5 10*3/uL (ref 3.4–10.8)

## 2018-07-18 LAB — PSA: Prostate Specific Ag, Serum: 1 ng/mL (ref 0.0–4.0)

## 2018-07-19 ENCOUNTER — Ambulatory Visit
Admission: RE | Admit: 2018-07-19 | Discharge: 2018-07-19 | Disposition: A | Discharge status: Home / Self Care | Payer: BC Managed Care – PPO | Source: Ambulatory Visit | Attending: Gastroenterology | Admitting: Gastroenterology

## 2018-07-19 ENCOUNTER — Encounter
Admission: RE | Disposition: A | Discharge status: Home / Self Care | Payer: Self-pay | Source: Ambulatory Visit | Attending: Gastroenterology

## 2018-07-19 ENCOUNTER — Ambulatory Visit: Payer: BC Managed Care – PPO | Admitting: Certified Registered"

## 2018-07-19 ENCOUNTER — Telehealth: Payer: Self-pay

## 2018-07-19 DIAGNOSIS — K200 Eosinophilic esophagitis: Secondary | ICD-10-CM | POA: Insufficient documentation

## 2018-07-19 DIAGNOSIS — Z1211 Encounter for screening for malignant neoplasm of colon: Secondary | ICD-10-CM | POA: Insufficient documentation

## 2018-07-19 DIAGNOSIS — K317 Polyp of stomach and duodenum: Secondary | ICD-10-CM | POA: Diagnosis not present

## 2018-07-19 DIAGNOSIS — K222 Esophageal obstruction: Secondary | ICD-10-CM | POA: Diagnosis not present

## 2018-07-19 DIAGNOSIS — I739 Peripheral vascular disease, unspecified: Secondary | ICD-10-CM | POA: Diagnosis not present

## 2018-07-19 DIAGNOSIS — Z79899 Other long term (current) drug therapy: Secondary | ICD-10-CM | POA: Insufficient documentation

## 2018-07-19 DIAGNOSIS — I10 Essential (primary) hypertension: Secondary | ICD-10-CM | POA: Diagnosis not present

## 2018-07-19 DIAGNOSIS — D120 Benign neoplasm of cecum: Secondary | ICD-10-CM | POA: Diagnosis not present

## 2018-07-19 DIAGNOSIS — D125 Benign neoplasm of sigmoid colon: Secondary | ICD-10-CM | POA: Insufficient documentation

## 2018-07-19 DIAGNOSIS — R1310 Dysphagia, unspecified: Secondary | ICD-10-CM

## 2018-07-19 DIAGNOSIS — Z8673 Personal history of transient ischemic attack (TIA), and cerebral infarction without residual deficits: Secondary | ICD-10-CM | POA: Diagnosis not present

## 2018-07-19 DIAGNOSIS — Z8371 Family history of colonic polyps: Secondary | ICD-10-CM | POA: Insufficient documentation

## 2018-07-19 HISTORY — PX: ESOPHAGOGASTRODUODENOSCOPY (EGD) WITH PROPOFOL: SHX5813

## 2018-07-19 HISTORY — PX: COLONOSCOPY WITH PROPOFOL: SHX5780

## 2018-07-19 SURGERY — COLONOSCOPY WITH PROPOFOL
Anesthesia: General

## 2018-07-19 MED ORDER — PROPOFOL 10 MG/ML IV BOLUS
INTRAVENOUS | Status: DC | PRN
  Administered 2018-07-19: 90 mg via INTRAVENOUS

## 2018-07-19 MED ORDER — MIDAZOLAM HCL 2 MG/2ML IJ SOLN
INTRAMUSCULAR | Status: DC | PRN
Start: 2018-07-19 — End: 2018-07-19
  Administered 2018-07-19: 2 mg via INTRAVENOUS

## 2018-07-19 MED ORDER — PROPOFOL 500 MG/50ML IV EMUL
INTRAVENOUS | Status: AC
Start: 2018-07-19 — End: ?
  Filled 2018-07-19: qty 50

## 2018-07-19 MED ORDER — LIDOCAINE HCL (CARDIAC) PF 100 MG/5ML IV SOSY
PREFILLED_SYRINGE | INTRAVENOUS | Status: DC | PRN
  Administered 2018-07-19: 100 mg via INTRAVENOUS

## 2018-07-19 MED ORDER — LIDOCAINE HCL (PF) 2 % IJ SOLN
INTRAMUSCULAR | Status: AC
  Filled 2018-07-19: qty 10

## 2018-07-19 MED ORDER — SODIUM CHLORIDE 0.9 % IV SOLN
INTRAVENOUS | Status: DC
  Administered 2018-07-19: 08:00:00 via INTRAVENOUS

## 2018-07-19 MED ORDER — MIDAZOLAM HCL 2 MG/2ML IJ SOLN
INTRAMUSCULAR | Status: AC
  Filled 2018-07-19: qty 2

## 2018-07-19 MED ORDER — PROPOFOL 500 MG/50ML IV EMUL
INTRAVENOUS | Status: DC | PRN
  Administered 2018-07-19: 130 ug/kg/min via INTRAVENOUS

## 2018-07-19 NOTE — Transfer of Care (Signed)
Immediate Anesthesia Transfer of Care Note  Patient: Nathaniel Gonzalez  Procedure(s) Performed: COLONOSCOPY WITH PROPOFOL (N/A ) ESOPHAGOGASTRODUODENOSCOPY (EGD) WITH PROPOFOL (N/A )  Patient Location: PACU  Anesthesia Type:General  Level of Consciousness: sedated  Airway & Oxygen Therapy: Patient Spontanous Breathing and Patient connected to nasal cannula oxygen  Post-op Assessment: Report given to RN and Post -op Vital signs reviewed and stable  Post vital signs: Reviewed and stable  Last Vitals:  Vitals Value Taken Time  BP    Temp    Pulse    Resp    SpO2      Last Pain:  Vitals:   07/19/18 0755  TempSrc: Tympanic  PainSc: 0-No pain         Complications: No apparent anesthesia complications

## 2018-07-19 NOTE — Telephone Encounter (Signed)
Referral faxed to [facility] today for consultation for resection of duodenal polyp.

## 2018-07-19 NOTE — Anesthesia Post-op Follow-up Note (Signed)
Anesthesia QCDR form completed.        

## 2018-07-19 NOTE — Anesthesia Procedure Notes (Signed)
Performed by: Adelie Croswell, CRNA Pre-anesthesia Checklist: Patient identified, Emergency Drugs available, Suction available, Patient being monitored and Timeout performed Patient Re-evaluated:Patient Re-evaluated prior to induction Oxygen Delivery Method: Nasal cannula Induction Type: IV induction       

## 2018-07-19 NOTE — Anesthesia Postprocedure Evaluation (Signed)
Anesthesia Post Note  Patient: Nathaniel Gonzalez  Procedure(s) Performed: COLONOSCOPY WITH PROPOFOL (N/A ) ESOPHAGOGASTRODUODENOSCOPY (EGD) WITH PROPOFOL (N/A )  Patient location during evaluation: PACU Anesthesia Type: General Level of consciousness: awake and alert Pain management: pain level controlled Vital Signs Assessment: post-procedure vital signs reviewed and stable Respiratory status: spontaneous breathing, nonlabored ventilation, respiratory function stable and patient connected to nasal cannula oxygen Cardiovascular status: blood pressure returned to baseline and stable Postop Assessment: no apparent nausea or vomiting Anesthetic complications: no     Last Vitals:  Vitals:   07/19/18 0906 07/19/18 0916  BP: 117/84 (!) 130/92  Pulse: 79 74  Resp: 16 14  Temp:    SpO2: 100% 98%    Last Pain:  Vitals:   07/19/18 0916  TempSrc:   PainSc: 0-No pain                 Durenda Hurt

## 2018-07-19 NOTE — H&P (Signed)
[name], MD [address], [place], [place], [place], [address] [address], [place], [place], [place], [address] Phone: [phone]  Fax: [phone]  Primary Care Physician:  Jerrol Banana., MD   Pre-Procedure History & Physical: HPI:  Nathaniel Gonzalez is a 52 y.o. male is here for an endoscopy and colonoscopy    Past Medical History:  Diagnosis Date  . Allergy   . Chicken pox   . Medical history non-contributory   . Stroke [facility])     Past Surgical History:  Procedure Laterality Date  . COLONOSCOPY  2011  . MOUTH SURGERY    . SKIN LESION EXCISION     several, he goes yearly  . TEE WITHOUT CARDIOVERSION N/A 08/17/2016   Procedure: TRANSESOPHAGEAL ECHOCARDIOGRAM (TEE);  Surgeon: Corey Skains, MD;  Location: [facility] ORS;  Service: Cardiovascular;  Laterality: N/A;    Prior to Admission medications   Medication Sig Start Date End Date Taking? Authorizing Provider  aspirin EC 325 MG EC tablet Take 1 tablet (325 mg total) by mouth daily. 07/08/16  Yes Max Sane, MD  atorvastatin (LIPITOR) 40 MG tablet TAKE 1 TABLET (40 MG TOTAL) BY MOUTH DAILY AT 6 PM. 07/27/17  Yes Jerrol Banana., MD  cetirizine (ZYRTEC) 10 MG tablet Take 10 mg by mouth daily.   Yes [provider]  Cholecalciferol (VITAMIN D-1000 MAX ST) 1000 units tablet Take by mouth.   Yes [provider]  verapamil (VERELAN PM) 180 MG 24 hr capsule Take 1 capsule (180 mg total) by mouth at bedtime. 01/01/18  Yes Jerrol Banana., MD  fluticasone [facility]) 50 MCG/ACT nasal spray Place 2 sprays into both nostrils daily as needed. Reported on 06/09/2016    [provider]    Allergies as of 06/29/2018 - Review Complete 06/28/2018  Allergen Reaction Noted  . Erythromycin Hives 03/28/2016    Family History  Problem Relation Age of Onset  . Skin cancer Mother   . Colon polyps Mother   . Cancer Mother        skin, ureter cancer  . Heart attack Father   .  COPD Father   . Aneurysm Father   . Colon polyps Father   . Heart disease Father   . Hypertension Brother   . Cancer Maternal Aunt        skin  . Cancer Maternal Grandmother        unknown type  . Heart disease Maternal Grandmother   . Cancer Paternal Grandmother        liver  . Heart disease Paternal Grandfather        died age 50 MI  . Cancer Cousin        skin    Social History   Socioeconomic History  . Marital status: Married    Spouse name: Not on file  . Number of children: Not on file  . Years of education: Not on file  . Highest education level: Not on file  Occupational History  . Not on file  Social Needs  . Financial resource strain: Not on file  . Food insecurity:    Worry: Not on file    Inability: Not on file  . Transportation needs:    Medical: Not on file    Non-medical: Not on file  Tobacco Use  . Smoking status: Never Smoker  . Smokeless tobacco: Never Used  Substance and Sexual Activity  . Alcohol use: No  .  Drug use: No  . Sexual activity: Not on file  Lifestyle  . Physical activity:    Days per week: Not on file    Minutes per session: Not on file  . Stress: Not on file  Relationships  . Social connections:    Talks on phone: Not on file    Gets together: Not on file    Attends religious service: Not on file    Active member of club or organization: Not on file    Attends meetings of clubs or organizations: Not on file    Relationship status: Not on file  . Intimate partner violence:    Fear of current or ex partner: Not on file    Emotionally abused: Not on file    Physically abused: Not on file    Forced sexual activity: Not on file  Other Topics Concern  . Not on file  Social History Narrative  . Not on file    Review of Systems: See HPI, otherwise negative ROS  Physical Exam: BP (!) 143/95   Pulse 86   Temp (!) 95.4 F (35.2 C) (Tympanic)   Resp 20   Ht 5\' 10"  (1.778 m)   Wt 97.5 kg   SpO2 98%   BMI 30.85 kg/m    General:   Alert,  pleasant and cooperative in NAD Head:  Normocephalic and atraumatic. Neck:  Supple; no masses or thyromegaly. Lungs:  Clear throughout to auscultation, normal respiratory effort.    Heart:  +S1, +S2, Regular rate and rhythm, No edema. Abdomen:  Soft, nontender and nondistended. Normal bowel sounds, without guarding, and without rebound.   Neurologic:  Alert and  oriented x4;  grossly normal neurologically.  Impression/Plan: Nathaniel Gonzalez is here for an endoscopy and colonoscopy  to be performed for  evaluation of dysphagia and colon cancer screening     Risks, benefits, limitations, and alternatives regarding endoscopy and dilation  have been reviewed with the patient.  Questions have been answered.  All parties agreeable.   [name], MD  07/19/2018, 7:59 AM

## 2018-07-19 NOTE — Anesthesia Preprocedure Evaluation (Signed)
Anesthesia Evaluation  Patient identified by MRN, date of birth, ID band Patient awake    Reviewed: Allergy & Precautions, H&P , NPO status , Patient's Chart, lab work & pertinent test results  History of Anesthesia Complications Negative for: history of anesthetic complications  Airway Mallampati: II  TM Distance: >3 FB Neck ROM: full    Dental   Pulmonary neg pulmonary ROS,    breath sounds clear to auscultation       Cardiovascular hypertension, + Peripheral Vascular Disease  negative cardio ROS   Rhythm:regular Rate:Normal     Neuro/Psych CVA (cerebellar ischemic infarct 2 years ago, residual right hand numbness), Residual Symptoms negative neurological ROS  negative psych ROS   GI/Hepatic negative GI ROS, Neg liver ROS,   Endo/Other  negative endocrine ROS  Renal/GU negative Renal ROS  negative genitourinary   Musculoskeletal   Abdominal   Peds  Hematology negative hematology ROS (+)   Anesthesia Other Findings Past Medical History: No date: Allergy No date: Chicken pox No date: Medical history non-contributory No date: Stroke [facility])  Past Surgical History: 2011: COLONOSCOPY No date: MOUTH SURGERY No date: SKIN LESION EXCISION     Comment:  several, he goes yearly 08/17/2016: TEE WITHOUT CARDIOVERSION; N/A     Comment:  Procedure: TRANSESOPHAGEAL ECHOCARDIOGRAM (TEE);                Surgeon: Corey Skains, MD;  Location: [facility] ORS;                Service: Cardiovascular;  Laterality: N/A;  BMI    Body Mass Index:  30.85 kg/m      Reproductive/Obstetrics negative OB ROS                             Anesthesia Physical Anesthesia Plan  ASA: III  Anesthesia Plan: General   Post-op Pain Management:    Induction:   PONV Risk Score and Plan:   Airway Management Planned:   Additional Equipment:   Intra-op Plan:   Post-operative Plan:   Informed Consent: I have  reviewed the patients History and Physical, chart, labs and discussed the procedure including the risks, benefits and alternatives for the proposed anesthesia with the patient or authorized representative who has indicated his/her understanding and acceptance.   Dental Advisory Given  Plan Discussed with: Anesthesiologist, CRNA and Surgeon  Anesthesia Plan Comments:         Anesthesia Quick Evaluation

## 2018-07-19 NOTE — Op Note (Signed)
[facility] Gastroenterology Patient Name: Nathaniel Gonzalez Procedure Date: 07/19/2018 8:17 AM MRN: [id number] Account #: [id number] Date of Birth: June 05, 1966 Admit Type: Outpatient Age: 52 Room: [facility] ENDO ROOM 3 Gender: Male Note Status: Finalized Procedure:            Upper GI endoscopy Indications:          Dysphagia Providers:            [name] MD, MD Referring MD:         Janine Ores. Rosanna Randy, MD (Referring MD) Medicines:            Monitored Anesthesia Care Complications:        No immediate complications. Procedure:            Pre-Anesthesia Assessment:                       - Prior to the procedure, a History and Physical was                        performed, and patient medications, allergies and                        sensitivities were reviewed. The patient's tolerance of                        previous anesthesia was reviewed.                       - The risks and benefits of the procedure and the                        sedation options and risks were discussed with the                        patient. All questions were answered and informed                        consent was obtained.                       - ASA Grade Assessment: II - A patient with mild                        systemic disease.                       After obtaining informed consent, the endoscope was                        passed under direct vision. Throughout the procedure,                        the patient's blood pressure, pulse, and oxygen                        saturations were monitored continuously. The Endoscope                        was introduced through the mouth, and advanced to the  third part of duodenum. The upper GI endoscopy was                        accomplished with ease. The patient tolerated the                        procedure well. Findings:      One 15 mm sessile polyp with no bleeding was found in the third portion       of  the duodenum.      The stomach was normal.      A mild Schatzki ring was found at the gastroesophageal junction. A TTS       dilator was passed through the scope. Dilation with an 18-19-20 mm       balloon dilator was performed to 19 mm. The dilation site was examined       following endoscope reinsertion and showed complete resolution of       luminal narrowing. Biopsies were obtained from the proximal and distal       esophagus with cold forceps for histology of suspected eosinophilic       esophagitis.      The cardia and gastric fundus were normal on retroflexion. Impression:           - One duodenal polyp.                       - Normal stomach.                       - Mild Schatzki ring. Dilated. Biopsied. Recommendation:       - Await pathology results.                       - Refer to [facility] or [facility] for EUS and EMR of duodenal polyp Procedure Code(s):    --- Professional ---                       [phone], Esophagogastroduodenoscopy, flexible, transoral;                        with transendoscopic balloon dilation of esophagus                        (less than 30 mm diameter)                       43239, Esophagogastroduodenoscopy, flexible, transoral;                        with biopsy, single or multiple Diagnosis Code(s):    --- Professional ---                       K31.7, Polyp of stomach and duodenum                       K22.2, Esophageal obstruction                       R13.10, Dysphagia, unspecified CPT copyright 2017 American Medical Association. All rights reserved. The codes documented in this report are preliminary and upon coder review may  be revised to meet current compliance requirements. [name], MD Bailey Mech  Vicente Males MD, MD 07/19/2018 8:37:44 AM This report has been signed electronically. Number of Addenda: 0 Note Initiated On: 07/19/2018 8:17 AM      [facility]

## 2018-07-19 NOTE — Op Note (Signed)
[facility] Gastroenterology Patient Name: Nathaniel Gonzalez Procedure Date: 07/19/2018 8:16 AM MRN: [id number] Account #: [id number] Date of Birth: December 31, 1965 Admit Type: Outpatient Age: 52 Room: [facility] ENDO ROOM 3 Gender: Male Note Status: Finalized Procedure:            Colonoscopy Indications:          Colon cancer screening in patient at increased risk:                        Family history of 1st-degree relative with colon polyps Providers:            [name] MD, MD Referring MD:         Janine Ores. Rosanna Randy, MD (Referring MD) Medicines:            Monitored Anesthesia Care Complications:        No immediate complications. Procedure:            Pre-Anesthesia Assessment:                       - Prior to the procedure, a History and Physical was                        performed, and patient medications, allergies and                        sensitivities were reviewed. The patient's tolerance of                        previous anesthesia was reviewed.                       - The risks and benefits of the procedure and the                        sedation options and risks were discussed with the                        patient. All questions were answered and informed                        consent was obtained.                       - ASA Grade Assessment: II - A patient with mild                        systemic disease.                       After obtaining informed consent, the colonoscope was                        passed under direct vision. Throughout the procedure,                        the patient's blood pressure, pulse, and oxygen                        saturations were monitored continuously. The  Colonoscope was introduced through the anus and                        advanced to the the cecum, identified by the                        appendiceal orifice, IC valve and transillumination.                        The colonoscopy  was performed with ease. The patient                        tolerated the procedure well. The quality of the bowel                        preparation was good. Findings:      The perianal and digital rectal examinations were normal.      A 3 mm polyp was found in the cecum. The polyp was sessile. The polyp       was removed with a cold biopsy forceps. Resection and retrieval were       complete.      A 6 mm polyp was found in the cecum. The polyp was sessile. The polyp       was removed with a cold snare. Resection was complete, but the polyp       tissue was not retrieved.      A 5 mm polyp was found in the sigmoid colon. The polyp was sessile. The       polyp was removed with a cold snare. Resection and retrieval were       complete.      The exam was otherwise without abnormality on direct and retroflexion       views. Impression:           - One 3 mm polyp in the cecum, removed with a cold                        biopsy forceps. Resected and retrieved.                       - One 6 mm polyp in the cecum, removed with a cold                        snare. Complete resection. Polyp tissue not retrieved.                       - One 5 mm polyp in the sigmoid colon, removed with a                        cold snare. Resected and retrieved.                       - The examination was otherwise normal on direct and                        retroflexion views. Recommendation:       - Discharge patient to home (with escort).                       - Resume  previous diet.                       - Continue present medications.                       - Await pathology results.                       - Repeat colonoscopy in 5 years for surveillance.                       - Return to my office in 8 weeks. Procedure Code(s):    --- Professional ---                       [id number], Colonoscopy, flexible; with removal of tumor(s),                        polyp(s), or other lesion(s) by snare technique                        45380, 5, Colonoscopy, flexible; with biopsy, single                        or multiple Diagnosis Code(s):    --- Professional ---                       Z83.71, Family history of colonic polyps                       D12.5, Benign neoplasm of sigmoid colon                       D12.0, Benign neoplasm of cecum CPT copyright 2017 American Medical Association. All rights reserved. The codes documented in this report are preliminary and upon coder review may  be revised to meet current compliance requirements. [name], MD [name] MD, MD 07/19/2018 8:53:28 AM This report has been signed electronically. Number of Addenda: 0 Note Initiated On: 07/19/2018 8:16 AM Scope Withdrawal Time: 0 hours 11 minutes 9 seconds  Total Procedure Duration: 0 hours 12 minutes 0 seconds       [facility]

## 2018-07-19 NOTE — Telephone Encounter (Signed)
-----   Message from [name], MD sent at 07/19/2018  8:59 AM EDT ----- Regarding: please arrange appointment  Nathaniel Gonzalez,  Please arrange referral to [place] or [facility] for EUS and resection of duodenal polyp    Regards    Dr [name]  Gastroenterology/Hepatology Pager: [phone]

## 2018-07-19 NOTE — Brief Op Note (Signed)
Cold snare cecum polyp not retrieved

## 2018-07-20 ENCOUNTER — Telehealth: Payer: Self-pay

## 2018-07-20 ENCOUNTER — Encounter: Payer: Self-pay | Admitting: Gastroenterology

## 2018-07-20 LAB — SURGICAL PATHOLOGY

## 2018-07-20 NOTE — Telephone Encounter (Signed)
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 Schedule office visit to discus results and treatment options . Colon polyps x2 adenomas repeat colonoscopy in 10 years  C/c Jerrol Banana., MD

## 2018-07-20 NOTE — Progress Notes (Signed)
okj

## 2018-07-20 NOTE — Telephone Encounter (Signed)
Tried contacting pt but vm was not set up yet.

## 2018-07-23 ENCOUNTER — Encounter: Payer: Self-pay | Admitting: Gastroenterology

## 2018-07-23 NOTE — Telephone Encounter (Signed)
Pt has a follow up appt scheduled with Dr. Vicente Males on 08/07/18 to discuss results.

## 2018-07-25 ENCOUNTER — Telehealth: Payer: Self-pay | Admitting: Gastroenterology

## 2018-07-25 NOTE — Telephone Encounter (Signed)
Pathology report faxed to [facility] GI procedures to attn: Claiborne Billings at [phone].

## 2018-07-25 NOTE — Telephone Encounter (Signed)
Claiborne Billings from [facility] GI Procedures left vm to optain pt Pathology report in regards to Patients referral from Dr. Vicente Males. cb [phone] option 2 she is available from 7-3

## 2018-07-26 ENCOUNTER — Other Ambulatory Visit: Payer: Self-pay | Admitting: Family Medicine

## 2018-07-26 DIAGNOSIS — I639 Cerebral infarction, unspecified: Secondary | ICD-10-CM

## 2018-08-07 ENCOUNTER — Ambulatory Visit: Payer: BC Managed Care – PPO | Admitting: Gastroenterology

## 2018-08-07 ENCOUNTER — Encounter: Payer: Self-pay | Admitting: Gastroenterology

## 2018-08-07 VITALS — BP 142/91 | HR 82 | Ht 70.0 in | Wt 220.8 lb

## 2018-08-07 DIAGNOSIS — K200 Eosinophilic esophagitis: Secondary | ICD-10-CM

## 2018-08-07 MED ORDER — OMEPRAZOLE 40 MG PO CPDR: 40.0000 mg | DELAYED_RELEASE_CAPSULE | Freq: Every day | ORAL | 3 refills | Status: DC

## 2018-08-07 MED ORDER — FLUTICASONE PROPIONATE HFA 220 MCG/ACT IN AERO: INHALATION_SPRAY | RESPIRATORY_TRACT | 6 refills | Status: DC

## 2018-08-07 NOTE — Progress Notes (Signed)
   [name] MD, MRCP(U.K) [address]  [place]  [place], [place] [address]  Main: [phone]  Fax: [phone]   Primary Care Physician: Jerrol Banana., MD  Primary Gastroenterologist:  Dr. [name]   No chief complaint on file.   HPI: Nathaniel Gonzalez is a 52 y.o. male   Summary of history :  He was initially seen 06/28/18 when referred for dysphagia and colonoscopy. The dysphagia began 5-8 years back , certain foods affected, like rice .  Interval history   06/28/2018-  08/07/2018  07/19/18  -EGD- duodenal polyp seen , schatzkis ring dilated to 19 mm , esophageal bx showed >20 Hpf eosinophils . Colonoscopy showed two tiny polyps that were resected- (adenomas)  Still has issues with swallowing     Current Outpatient Medications  Medication Sig Dispense Refill  . aspirin EC 325 MG EC tablet Take 1 tablet (325 mg total) by mouth daily. 30 tablet 0  . atorvastatin (LIPITOR) 40 MG tablet TAKE 1 TABLET (40 MG TOTAL) BY MOUTH DAILY AT 6 PM. 90 tablet 3  . cetirizine (ZYRTEC) 10 MG tablet Take 10 mg by mouth daily.    . Cholecalciferol (VITAMIN D-1000 MAX ST) 1000 units tablet Take by mouth.    . fluticasone (FLONASE) 50 MCG/ACT nasal spray Place 2 sprays into both nostrils daily as needed. Reported on 06/09/2016    . verapamil (VERELAN PM) 180 MG 24 hr capsule Take 1 capsule (180 mg total) by mouth at bedtime. 90 capsule 3   No current facility-administered medications for this visit.     Allergies as of 08/07/2018 - Review Complete 07/19/2018  Allergen Reaction Noted  . Erythromycin Hives 03/28/2016    ROS:  General: Negative for anorexia, weight loss, fever, chills, fatigue, weakness. ENT: Negative for hoarseness, difficulty swallowing , nasal congestion. CV: Negative for chest pain, angina, palpitations, dyspnea on exertion, peripheral edema.  Respiratory: Negative for dyspnea at rest, dyspnea on exertion, cough, sputum, wheezing.  GI: See  history of present illness. GU:  Negative for dysuria, hematuria, urinary incontinence, urinary frequency, nocturnal urination.  Endo: Negative for unusual weight change.    Physical Examination:   There were no vitals taken for this visit.  General: Well-nourished, well-developed in no acute distress.  Eyes: No icterus. Conjunctivae pink. Mouth: Oropharyngeal mucosa moist and pink , no lesions erythema or exudate. Lungs: Clear to auscultation bilaterally. Non-labored. Heart: Regular rate and rhythm, no murmurs rubs or gallops.  Abdomen: Bowel sounds are normal, nontender, nondistended, no hepatosplenomegaly or masses, no abdominal bruits or hernia , no rebound or guarding.   Extremities: No lower extremity edema. No clubbing or deformities. Neuro: Alert and oriented x 3.  Grossly intact. Skin: Warm and dry, no jaundice.   Psych: Alert and cooperative, normal mood and affect.   Imaging Studies: No results found.  Assessment and Plan:   Nathaniel Gonzalez is a 52 y.o. 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 Inidentally found a duodneal polyp ?abnromal opening of the pancreas   Plan  1. PPI 2. Trial of Fluticasone for 6 weeks, patient information on eosinophilic esophagitis.  3. ENT referral for allergy testing  4. Referred to [facility] for excision of duodenal sessile polyp vs abberant opening of pancreas- scheduled for 08/22/18     Dr [name]  MD,MRCP [facility]) Follow up in 2 months

## 2018-08-07 NOTE — Addendum Note (Signed)
Addended by: Dorethea Clan on: 08/07/2018 03:21 PM   Modules accepted: Orders

## 2018-10-02 ENCOUNTER — Ambulatory Visit: Payer: BC Managed Care – PPO | Admitting: Gastroenterology

## 2018-10-09 ENCOUNTER — Telehealth: Payer: Self-pay | Admitting: Gastroenterology

## 2018-10-09 NOTE — Telephone Encounter (Signed)
Patient called and [facility] never got the referral from Dr. Vicente Males. Its been 6-8 weeks. Please call patient.

## 2018-10-11 ENCOUNTER — Ambulatory Visit: Payer: BC Managed Care – PPO | Admitting: Gastroenterology

## 2018-10-11 ENCOUNTER — Other Ambulatory Visit: Payer: Self-pay

## 2018-10-11 NOTE — Telephone Encounter (Signed)
Returned patients call regarding referral to ENT.  Referral has been faxed to ENT.  Thanks [employer]

## 2018-11-27 ENCOUNTER — Encounter: Payer: Self-pay | Admitting: *Deleted

## 2018-11-27 ENCOUNTER — Ambulatory Visit: Payer: BC Managed Care – PPO | Admitting: Gastroenterology

## 2018-12-19 ENCOUNTER — Encounter: Payer: Self-pay | Admitting: Family Medicine

## 2018-12-19 ENCOUNTER — Ambulatory Visit: Payer: BC Managed Care – PPO | Admitting: Family Medicine

## 2018-12-19 VITALS — BP 122/82 | HR 68 | Temp 97.8°F | Resp 16 | Wt 221.0 lb

## 2018-12-19 DIAGNOSIS — R002 Palpitations: Secondary | ICD-10-CM | POA: Diagnosis not present

## 2018-12-19 DIAGNOSIS — E782 Mixed hyperlipidemia: Secondary | ICD-10-CM

## 2018-12-19 DIAGNOSIS — I10 Essential (primary) hypertension: Secondary | ICD-10-CM

## 2018-12-19 DIAGNOSIS — I63019 Cerebral infarction due to thrombosis of unspecified vertebral artery: Secondary | ICD-10-CM

## 2018-12-19 NOTE — Progress Notes (Signed)
Patient: Nathaniel Gonzalez Male    DOB: August 12, 1966   53 y.o.   MRN: [id number] Visit Date: 12/19/2018  Today's Provider: Wilhemena Durie, MD   Chief Complaint  Patient presents with  . Hypertension  . Hyperlipidemia   Subjective:     HPI   Hypertension, follow-up:  BP Readings from Last 3 Encounters:  12/19/18 122/82  08/07/18 (!) 142/91  07/19/18 (!) 135/93    He was last seen for hypertension 6 months ago.  BP at that visit was 120/82 . Management since that visit includes no changes. He reports good compliance with treatment. He is not having side effects.  He is not exercising. He is adherent to low salt diet.   Outside blood pressures are checked occasionally. He is experiencing none.  Patient denies exertional chest pressure/discomfort, lower extremity edema and palpitations.    Weight trend: stable Wt Readings from Last 3 Encounters:  12/19/18 221 lb (100.2 kg)  08/07/18 220 lb 12.8 oz (100.2 kg)  07/19/18 215 lb (97.5 kg)    Current diet: well balanced    Lipid/Cholesterol, Follow-up:   Last seen for this6 months ago.  Management changes since that visit include no changes. . Last Lipid Panel:    Component Value Date/Time   CHOL 134 07/17/2018 1143   TRIG 143 07/17/2018 1143   HDL 33 (L) 07/17/2018 1143   CHOLHDL 6.4 07/07/2016 0519   VLDL 40 07/07/2016 0519   LDLCALC 72 07/17/2018 1143    Risk factors for vascular disease include hypertension  He reports good compliance with treatment. He is not having side effects.  Current symptoms include none and have been stable.   Allergies  Allergen Reactions  . Erythromycin Hives     Current Outpatient Medications:  .  aspirin EC 325 MG EC tablet, Take 1 tablet (325 mg total) by mouth daily., Disp: 30 tablet, Rfl: 0 .  atorvastatin (LIPITOR) 40 MG tablet, TAKE 1 TABLET (40 MG TOTAL) BY MOUTH DAILY AT 6 PM., Disp: 90 tablet, Rfl: 3 .  cetirizine (ZYRTEC) 10 MG tablet, Take 10  mg by mouth daily., Disp: , Rfl:  .  Cholecalciferol (VITAMIN D-1000 MAX ST) 1000 units tablet, Take by mouth., Disp: , Rfl:  .  fluticasone (FLONASE) 50 MCG/ACT nasal spray, Place 2 sprays into both nostrils daily as needed. Reported on 06/09/2016, Disp: , Rfl:  .  fluticasone (FLOVENT HFA) 220 MCG/ACT inhaler, Take two puffs and swallow twice daily for 8 weeks. **RINSE MOUTH OUT AFTER EACH USE**, Disp: 1 Inhaler, Rfl: 6 .  omeprazole (PRILOSEC) 40 MG capsule, Take 1 capsule (40 mg total) by mouth daily., Disp: 90 capsule, Rfl: 3 .  verapamil (VERELAN PM) 180 MG 24 hr capsule, Take 1 capsule (180 mg total) by mouth at bedtime., Disp: 90 capsule, Rfl: 3  Review of Systems  Constitutional: Negative.   Respiratory: Negative.   Cardiovascular: Negative.   Endocrine: Negative.   Musculoskeletal: Negative.   Neurological: Negative.   Psychiatric/Behavioral: Negative.     Social History   Tobacco Use  . Smoking status: Never Smoker  . Smokeless tobacco: Never Used  Substance Use Topics  . Alcohol use: No      Objective:   BP 122/82 (BP Location: Left Arm, Patient Position: Sitting, Cuff Size: Normal)   Pulse 68   Temp 97.8 F (36.6 C)   Resp 16   Wt 221 lb (100.2 kg)   SpO2 97%   BMI  31.71 kg/m  Vitals:   12/19/18 1620  BP: 122/82  Pulse: 68  Resp: 16  Temp: 97.8 F (36.6 C)  SpO2: 97%  Weight: 221 lb (100.2 kg)     Physical Exam Constitutional:      Appearance: Normal appearance. He is well-developed and normal weight.  HENT:     Head: Normocephalic and atraumatic.     Right Ear: External ear normal.     Left Ear: External ear normal.     Nose: Nose normal.  Eyes:     General: No scleral icterus.    Conjunctiva/sclera: Conjunctivae normal.  Neck:     Thyroid: No thyromegaly.  Cardiovascular:     Rate and Rhythm: Normal rate and regular rhythm.     Heart sounds: Normal heart sounds.  Pulmonary:     Effort: Pulmonary effort is normal.  Abdominal:      Palpations: Abdomen is soft.  Lymphadenopathy:     Cervical: No cervical adenopathy.  Skin:    General: Skin is warm and dry.  Neurological:     General: No focal deficit present.     Mental Status: He is alert and oriented to person, place, and time. Mental status is at baseline.     Cranial Nerves: No cranial nerve deficit.     Motor: No abnormal muscle tone.     Coordination: Coordination normal.     Comments: Grossly nonfocal.  Psychiatric:        Mood and Affect: Mood normal.        Behavior: Behavior normal.        Thought Content: Thought content normal.        Judgment: Judgment normal.         Assessment & Plan    1. Benign essential HTN Controlled.  2. 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  3. Palpitations Controlled.  4. Hyperlipidemia, mixed Treated  I have done the exam and reviewed the chart and it is accurate to the best of my knowledge. Development worker, community has been used and  any errors in dictation or transcription are unintentional. Miguel Aschoff M.D. [place], MD  [facility]

## 2018-12-30 ENCOUNTER — Other Ambulatory Visit: Payer: Self-pay | Admitting: Family Medicine

## 2018-12-30 DIAGNOSIS — I10 Essential (primary) hypertension: Secondary | ICD-10-CM

## 2018-12-30 DIAGNOSIS — I639 Cerebral infarction, unspecified: Secondary | ICD-10-CM

## 2019-06-17 ENCOUNTER — Other Ambulatory Visit: Payer: Self-pay

## 2019-06-17 ENCOUNTER — Ambulatory Visit (INDEPENDENT_AMBULATORY_CARE_PROVIDER_SITE_OTHER): Payer: BC Managed Care – PPO | Admitting: Family Medicine

## 2019-06-17 ENCOUNTER — Encounter: Payer: Self-pay | Admitting: Family Medicine

## 2019-06-17 VITALS — BP 147/90 | HR 76 | Temp 98.6°F | Wt 223.2 lb

## 2019-06-17 DIAGNOSIS — Z23 Encounter for immunization: Secondary | ICD-10-CM | POA: Diagnosis not present

## 2019-06-17 DIAGNOSIS — Z0000 Encounter for general adult medical examination without abnormal findings: Secondary | ICD-10-CM

## 2019-06-17 NOTE — Progress Notes (Signed)
Patient: Nathaniel Gonzalez, Male    DOB: 16-Jan-1966, 53 y.o.   MRN: [id number] Visit Date: 06/17/2019  Today's Provider: Wilhemena Durie, MD   Chief Complaint  Patient presents with  . Annual Exam   Subjective:    Annual physical exam Nathaniel Gonzalez is a 53 y.o. male who presents today for health maintenance and complete physical. He feels well. He reports exercising none. He reports he is sleeping well. Overall he feels well. ----------------------------------------------------------------- Last colonoscopy: 07/19/2018  Review of Systems  Constitutional: Negative.   HENT: Negative.   Eyes: Negative.   Respiratory: Positive for cough.   Cardiovascular: Negative.   Gastrointestinal: Negative.   Endocrine: Negative.   Genitourinary: Negative.   Musculoskeletal: Negative.   Skin: Negative.   Allergic/Immunologic: Positive for environmental allergies and food allergies.  Neurological: Positive for numbness.  Hematological: Negative.   Psychiatric/Behavioral: Negative.     Social History He  reports that he has never smoked. He has never used smokeless tobacco. He reports that he does not drink alcohol or use drugs. Social History   Socioeconomic History  . Marital status: Married    Spouse name: Not on file  . Number of children: Not on file  . Years of education: Not on file  . Highest education level: Not on file  Occupational History  . Not on file  Social Needs  . Financial resource strain: Not on file  . Food insecurity    Worry: Not on file    Inability: Not on file  . Transportation needs    Medical: Not on file    Non-medical: Not on file  Tobacco Use  . Smoking status: Never Smoker  . Smokeless tobacco: Never Used  Substance and Sexual Activity  . Alcohol use: No  . Drug use: No  . Sexual activity: Not on file  Lifestyle  . Physical activity    Days per week: Not on file    Minutes per session: Not on file  . Stress: Not  on file  Relationships  . Social Herbalist on phone: Not on file    Gets together: Not on file    Attends religious service: Not on file    Active member of club or organization: Not on file    Attends meetings of clubs or organizations: Not on file    Relationship status: Not on file  Other Topics Concern  . Not on file  Social History Narrative  . Not on file    Patient Active Problem List   Diagnosis Date Noted  . Benign essential HTN 04/27/2017  . Hyperlipidemia, mixed 04/27/2017  . Palpitations 07/25/2016  . Cerebral infarction ([place]) 07/07/2016  . 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 Family history of colonic polyps 11/12/2008    Past Surgical History:  Procedure Laterality Date  . COLONOSCOPY  2011  . COLONOSCOPY WITH PROPOFOL N/A 07/19/2018   Procedure: COLONOSCOPY WITH PROPOFOL;  Surgeon: [name], MD;  Location: [facility] ENDOSCOPY;  Service: Gastroenterology;  Laterality: N/A;  . ESOPHAGOGASTRODUODENOSCOPY (EGD) WITH PROPOFOL N/A 07/19/2018   Procedure: ESOPHAGOGASTRODUODENOSCOPY (EGD) WITH PROPOFOL;  Surgeon: [name], MD;  Location: [facility] ENDOSCOPY;  Service: Gastroenterology;  Laterality: N/A;  . MOUTH SURGERY    . SKIN LESION EXCISION     several, he goes yearly  . TEE WITHOUT CARDIOVERSION N/A 08/17/2016   Procedure: TRANSESOPHAGEAL ECHOCARDIOGRAM (TEE);  Surgeon: Corey Skains, MD;  Location: [facility] ORS;  Service:  Cardiovascular;  Laterality: N/A;    Family History  Family Status  Relation Name Status  . Mother  Deceased  . Father  Alive  . Brother  Alive  . Brother  Alive  . Daughter  Alive  . Son  Alive  . Mat Aunt  Deceased  . MGM  Deceased  . MGF  Deceased  . PGM  Deceased  . PGF  Deceased  . Cousin  Alive   His family history includes Aneurysm in his father; COPD in his father; Cancer in his cousin, maternal aunt, maternal grandmother, mother, and paternal grandmother; Colon polyps in his father and mother; Heart attack in his father;  Heart disease in his father, maternal grandmother, and paternal grandfather; Hypertension in his brother; Skin cancer in his mother.     Allergies  Allergen Reactions  . Erythromycin Hives    Previous Medications   ASPIRIN EC 325 MG EC TABLET    Take 1 tablet (325 mg total) by mouth daily.   ATORVASTATIN (LIPITOR) 40 MG TABLET    TAKE 1 TABLET (40 MG TOTAL) BY MOUTH DAILY AT 6 PM.   CETIRIZINE (ZYRTEC) 10 MG TABLET    Take 10 mg by mouth daily.   CHOLECALCIFEROL (VITAMIN D-1000 MAX ST) 1000 UNITS TABLET    Take by mouth.   FLUTICASONE (FLONASE) 50 MCG/ACT NASAL SPRAY    Place 2 sprays into both nostrils daily as needed. Reported on 06/09/2016   FLUTICASONE (FLOVENT HFA) 220 MCG/ACT INHALER    Take two puffs and swallow twice daily for 8 weeks. **RINSE MOUTH OUT AFTER EACH USE**   OMEPRAZOLE (PRILOSEC) 40 MG CAPSULE    Take 1 capsule (40 mg total) by mouth daily.   VERAPAMIL (VERELAN PM) 180 MG 24 HR CAPSULE    TAKE 1 CAPSULE (180 MG TOTAL) BY MOUTH AT BEDTIME.    Patient Care Team: Jerrol Banana., MD as PCP - General (Family Medicine) Vladimir Crofts, MD as Consulting Physician (Neurology) Corey Skains, MD as Consulting Physician (Cardiology)      Objective:   Vitals: BP (!) 147/90 (BP Location: Right Arm, Patient Position: Sitting, Cuff Size: Normal)   Pulse 76   Temp 98.6 F (37 C) (Oral)   Wt 223 lb 3.2 oz (101.2 kg)   BMI 32.03 kg/m    Physical Exam Vitals signs reviewed.  Constitutional:      Appearance: He is well-developed.  HENT:     Head: Normocephalic and atraumatic.     Right Ear: External ear normal.     Left Ear: External ear normal.     Nose: Nose normal.  Eyes:     General: No scleral icterus.    Conjunctiva/sclera: Conjunctivae normal.  Neck:     Thyroid: No thyromegaly.  Cardiovascular:     Rate and Rhythm: Normal rate and regular rhythm.     Heart sounds: Normal heart sounds.  Pulmonary:     Effort: Pulmonary effort is normal.      Breath sounds: Normal breath sounds.  Abdominal:     Palpations: Abdomen is soft.  Skin:    General: Skin is warm and dry.  Neurological:     Mental Status: He is alert and oriented to person, place, and time. Mental status is at baseline.     Comments: Still feels tingling in left forearm and hand.  Psychiatric:        Mood and Affect: Mood normal.  Behavior: Behavior normal.        Thought Content: Thought content normal.        Judgment: Judgment normal.      Depression Screen PHQ 2/9 Scores 06/13/2018 06/12/2017 01/31/2017  PHQ - 2 Score 1 0 0  PHQ- 9 Score 2 0 2      Assessment & Plan:   I, Porsha McClurkin CMA, am acting as a scribe for Wilhemena Durie., MD.    Routine Health Maintenance and Physical Exam  Exercise Activities and Dietary recommendations Goals   None     Immunization History  Administered Date(s) Administered  . Tdap 11/12/2008    Health Maintenance  Topic Date Due  . TETANUS/TDAP  11/12/2018  . INFLUENZA VACCINE  07/06/2019  . COLONOSCOPY  07/20/2023  . HIV Screening  Completed     Discussed health benefits of physical activity, and encouraged him to engage in regular exercise appropriate for his age and condition.    -------------------------------------------------------------------- I have done the exam and reviewed the chart and it is accurate to the best of my knowledge. Development worker, community has been used and  any errors in dictation or transcription are unintentional. Miguel Aschoff M.D. [facility]

## 2019-06-18 ENCOUNTER — Telehealth: Payer: Self-pay

## 2019-06-18 LAB — CBC WITH DIFFERENTIAL/PLATELET
Basophils Absolute: 0 10*3/uL (ref 0.0–0.2)
Basos: 1 %
EOS (ABSOLUTE): 0.3 10*3/uL (ref 0.0–0.4)
Eos: 4 %
Hematocrit: 44.3 % (ref 37.5–51.0)
Hemoglobin: 15.9 g/dL (ref 13.0–17.7)
Immature Grans (Abs): 0 10*3/uL (ref 0.0–0.1)
Immature Granulocytes: 0 %
Lymphocytes Absolute: 2.1 10*3/uL (ref 0.7–3.1)
Lymphs: 33 %
MCH: 30.7 pg (ref 26.6–33.0)
MCHC: 35.9 g/dL — ABNORMAL HIGH (ref 31.5–35.7)
MCV: 86 fL (ref 79–97)
Monocytes Absolute: 0.5 10*3/uL (ref 0.1–0.9)
Monocytes: 7 %
Neutrophils Absolute: 3.5 10*3/uL (ref 1.4–7.0)
Neutrophils: 55 %
Platelets: 241 10*3/uL (ref 150–450)
RBC: 5.18 x10E6/uL (ref 4.14–5.80)
RDW: 11.9 % (ref 11.6–15.4)
WBC: 6.4 10*3/uL (ref 3.4–10.8)

## 2019-06-18 LAB — COMPREHENSIVE METABOLIC PANEL
ALT: 24 IU/L (ref 0–44)
AST: 15 IU/L (ref 0–40)
Albumin/Globulin Ratio: 1.8 (ref 1.2–2.2)
Albumin: 4.2 g/dL (ref 3.8–4.9)
Alkaline Phosphatase: 79 IU/L (ref 39–117)
BUN/Creatinine Ratio: 12 (ref 9–20)
BUN: 14 mg/dL (ref 6–24)
Bilirubin Total: 1 mg/dL (ref 0.0–1.2)
CO2: 20 mmol/L (ref 20–29)
Calcium: 9.2 mg/dL (ref 8.7–10.2)
Chloride: 104 mmol/L (ref 96–106)
Creatinine, Ser: 1.13 mg/dL (ref 0.76–1.27)
GFR calc Af Amer: 87 mL/min/{1.73_m2} (ref >59)
GFR calc non Af Amer: 75 mL/min/{1.73_m2} (ref >59)
Globulin, Total: 2.3 g/dL (ref 1.5–4.5)
Glucose: 86 mg/dL (ref 65–99)
Potassium: 4 mmol/L (ref 3.5–5.2)
Sodium: 138 mmol/L (ref 134–144)
Total Protein: 6.5 g/dL (ref 6.0–8.5)

## 2019-06-18 LAB — LIPID PANEL
Chol/HDL Ratio: 3.5 ratio (ref 0.0–5.0)
Cholesterol, Total: 121 mg/dL (ref 100–199)
HDL: 35 mg/dL — ABNORMAL LOW (ref >39)
LDL Calculated: 53 mg/dL (ref 0–99)
Triglycerides: 165 mg/dL — ABNORMAL HIGH (ref 0–149)
VLDL Cholesterol Cal: 33 mg/dL (ref 5–40)

## 2019-06-18 LAB — TSH: TSH: 2.29 u[IU]/mL (ref 0.450–4.500)

## 2019-06-18 NOTE — Telephone Encounter (Signed)
Patient has been advised. KW 

## 2019-06-18 NOTE — Telephone Encounter (Signed)
-----   Message from Jerrol Banana., MD sent at 06/18/2019  8:18 AM EDT ----- Labs ok

## 2019-08-04 ENCOUNTER — Other Ambulatory Visit: Payer: Self-pay | Admitting: Family Medicine

## 2019-08-04 DIAGNOSIS — I639 Cerebral infarction, unspecified: Secondary | ICD-10-CM

## 2019-08-13 ENCOUNTER — Other Ambulatory Visit: Payer: Self-pay

## 2019-08-13 ENCOUNTER — Ambulatory Visit (INDEPENDENT_AMBULATORY_CARE_PROVIDER_SITE_OTHER): Payer: BC Managed Care – PPO

## 2019-08-13 DIAGNOSIS — Z23 Encounter for immunization: Secondary | ICD-10-CM | POA: Diagnosis not present

## 2019-12-02 NOTE — Progress Notes (Signed)
Patient: Nathaniel Gonzalez Male    DOB: [birth year]   53 y.o.   MRN: [id number] Visit Date: 12/10/2019  Today's Provider: Wilhemena Durie, MD   Chief Complaint  Patient presents with  . Follow-up  . Hypertension  . Hyperlipidemia   Subjective:    HPI   Follow up for Cerebral infarction  The patient was last seen for this 6 months ago. Changes made at last visit include no changes.  He reports excellent compliance with treatment. He feels that condition is Improved. He is not having side effects.   ------------------------------------------------------------------------------------   Hypertension, follow-up:  BP Readings from Last 3 Encounters:  12/10/19 (!) 148/87  06/17/19 (!) 147/90  12/19/18 122/82    He was last seen for hypertension 6 months ago.  BP at that visit was 147/90. Management changes since that visit include no changes. He reports excellent compliance with treatment. He is not having side effects.  He is not exercising. He is adherent to low salt diet.   Outside blood pressures are stable. He is experiencing none.  Patient denies chest pain.   Cardiovascular risk factors include hypertension and male gender.  Use of agents associated with hypertension: none.     Weight trend: stable Wt Readings from Last 3 Encounters:  12/10/19 225 lb 9.6 oz (102.3 kg)  06/17/19 223 lb 3.2 oz (101.2 kg)  12/19/18 221 lb (100.2 kg)    Current diet: well balanced  ------------------------------------------------------------------------   Lipid/Cholesterol, Follow-up:   Last seen for this 6 months ago.  Management changes since that visit include no changes on medication. Labs checked.  Last Lipid Panel:    Component Value Date/Time   CHOL 121 06/17/2019 1121   TRIG 165 (H) 06/17/2019 1121   HDL 35 (L) 06/17/2019 1121   CHOLHDL 3.5 06/17/2019 1121   CHOLHDL 6.4 07/07/2016 0519   VLDL 40 07/07/2016 0519   LDLCALC 53 06/17/2019 1121     Risk factors for vascular disease include hypercholesterolemia and hypertension  He reports excellent compliance with treatment. He is not having side effects.  Current symptoms include none and have been stable.  Wt Readings from Last 3 Encounters:  12/10/19 225 lb 9.6 oz (102.3 kg)  06/17/19 223 lb 3.2 oz (101.2 kg)  12/19/18 221 lb (100.2 kg)   Pt worried about some tenderness right hand and right foot. It feels as if he is walking on a pebble.Not consistent. Pt worried about this as he says he thinks his brother has a sarcoma of toe. -------------------------------------------------------------------    Allergies  Allergen Reactions  . Erythromycin Hives     Current Outpatient Medications:  .  aspirin EC 325 MG EC tablet, Take 1 tablet (325 mg total) by mouth daily., Disp: 30 tablet, Rfl: 0 .  atorvastatin (LIPITOR) 40 MG tablet, TAKE 1 TABLET (40 MG TOTAL) BY MOUTH DAILY AT 6 PM., Disp: 90 tablet, Rfl: 3 .  cetirizine (ZYRTEC) 10 MG tablet, Take 10 mg by mouth daily., Disp: , Rfl:  .  Cholecalciferol (VITAMIN D-1000 MAX ST) 1000 units tablet, Take by mouth., Disp: , Rfl:  .  fluticasone (FLONASE) 50 MCG/ACT nasal spray, Place 2 sprays into both nostrils daily as needed. Reported on 06/09/2016, Disp: , Rfl:  .  verapamil (CALAN-SR) 240 MG CR tablet, Take 1 tablet (240 mg total) by mouth at bedtime., Disp: 30 tablet, Rfl: 6  Review of Systems  Constitutional: Negative for appetite change, chills and fever.  Eyes:  Negative.   Respiratory: Negative for chest tightness, shortness of breath and wheezing.   Cardiovascular: Negative for chest pain and palpitations.  Gastrointestinal: Negative for abdominal pain, nausea and vomiting.  Endocrine: Negative.   Musculoskeletal: Positive for arthralgias.  Allergic/Immunologic: Negative.   Neurological: Negative for numbness and headaches.  Psychiatric/Behavioral: Negative.     Social History   Tobacco Use  . Smoking status:  Never Smoker  . Smokeless tobacco: Never Used  Substance Use Topics  . Alcohol use: No      Objective:   BP (!) 148/87   Pulse 89   Temp (!) 97.3 F (36.3 C) (Temporal)   Resp 16   Ht 5\' 10"  (1.778 m)   Wt 225 lb 9.6 oz (102.3 kg)   BMI 32.37 kg/m  Vitals:   12/10/19 1539 12/10/19 1625  BP: (!) 143/83 (!) 148/87  Pulse: 86 89  Resp: 16   Temp: (!) 97.3 F (36.3 C)   TempSrc: Temporal   Weight: 225 lb 9.6 oz (102.3 kg)   Height: 5\' 10"  (1.778 m)   Body mass index is 32.37 kg/m.   Physical Exam Constitutional:      Appearance: Normal appearance.  HENT:     Head: Normocephalic and atraumatic.     Right Ear: External ear normal.     Left Ear: External ear normal.  Eyes:     General: No scleral icterus.    Conjunctiva/sclera: Conjunctivae normal.  Cardiovascular:     Rate and Rhythm: Normal rate and regular rhythm.     Pulses: Normal pulses.     Heart sounds: Normal heart sounds.  Pulmonary:     Breath sounds: Normal breath sounds.  Abdominal:     Palpations: Abdomen is soft.  Musculoskeletal:     Comments: Examination of right hand and foot both normal.  Skin:    General: Skin is warm and dry.  Neurological:     Mental Status: He is alert and oriented to person, place, and time.  Psychiatric:        Mood and Affect: Mood normal.        Behavior: Behavior normal.        Thought Content: Thought content normal.        Judgment: Judgment normal.      No results found for any visits on 12/10/19.     Assessment & Plan    1. 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  2. Benign essential HTN Increase verapamil fro 180 to 240 mg daily.More than 50% 25 minute visit spent in counseling or coordination of care  - verapamil (CALAN-SR) 240 MG CR tablet; Take 1 tablet (240 mg total) by mouth at bedtime.  Dispense: 30 tablet; Refill: 6  3. Hyperlipidemia,  mixed Lipitor.  4. Plantar fasciitis of right foot Xray foot/hand if symptoms persist.     Wilhemena Durie, MD  [facility]

## 2019-12-09 ENCOUNTER — Ambulatory Visit: Payer: Self-pay | Admitting: Family Medicine

## 2019-12-10 ENCOUNTER — Ambulatory Visit: Payer: BC Managed Care – PPO | Admitting: Family Medicine

## 2019-12-10 ENCOUNTER — Other Ambulatory Visit: Payer: Self-pay

## 2019-12-10 ENCOUNTER — Encounter: Payer: Self-pay | Admitting: Family Medicine

## 2019-12-10 VITALS — BP 148/87 | HR 89 | Temp 97.3°F | Resp 16 | Ht 70.0 in | Wt 225.6 lb

## 2019-12-10 DIAGNOSIS — E782 Mixed hyperlipidemia: Secondary | ICD-10-CM | POA: Diagnosis not present

## 2019-12-10 DIAGNOSIS — M722 Plantar fascial fibromatosis: Secondary | ICD-10-CM | POA: Diagnosis not present

## 2019-12-10 DIAGNOSIS — I63019 Cerebral infarction due to thrombosis of unspecified vertebral artery: Secondary | ICD-10-CM | POA: Diagnosis not present

## 2019-12-10 DIAGNOSIS — I10 Essential (primary) hypertension: Secondary | ICD-10-CM

## 2019-12-10 MED ORDER — VERAPAMIL HCL ER 240 MG PO TBCR: 240.0000 mg | EXTENDED_RELEASE_TABLET | Freq: Every day | ORAL | 6 refills | Status: DC

## 2019-12-10 NOTE — Patient Instructions (Addendum)
Verapamil 240 mg daily.

## 2020-04-09 ENCOUNTER — Emergency Department: Payer: BC Managed Care – PPO

## 2020-04-09 ENCOUNTER — Other Ambulatory Visit: Payer: Self-pay

## 2020-04-09 ENCOUNTER — Emergency Department
Admission: EM | Admit: 2020-04-09 | Discharge: 2020-04-09 | Disposition: A | Discharge status: Home / Self Care | Payer: BC Managed Care – PPO | Attending: Emergency Medicine | Admitting: Emergency Medicine

## 2020-04-09 ENCOUNTER — Ambulatory Visit: Payer: Self-pay | Admitting: *Deleted

## 2020-04-09 DIAGNOSIS — I10 Essential (primary) hypertension: Secondary | ICD-10-CM | POA: Insufficient documentation

## 2020-04-09 DIAGNOSIS — Z8673 Personal history of transient ischemic attack (TIA), and cerebral infarction without residual deficits: Secondary | ICD-10-CM | POA: Insufficient documentation

## 2020-04-09 DIAGNOSIS — I4891 Unspecified atrial fibrillation: Secondary | ICD-10-CM | POA: Diagnosis not present

## 2020-04-09 DIAGNOSIS — R002 Palpitations: Secondary | ICD-10-CM | POA: Diagnosis present

## 2020-04-09 DIAGNOSIS — Z79899 Other long term (current) drug therapy: Secondary | ICD-10-CM | POA: Insufficient documentation

## 2020-04-09 LAB — CBC
HCT: 46.1 % (ref 39.0–52.0)
Hemoglobin: 16.7 g/dL (ref 13.0–17.0)
MCH: 30.5 pg (ref 26.0–34.0)
MCHC: 36.2 g/dL — ABNORMAL HIGH (ref 30.0–36.0)
MCV: 84.1 fL (ref 80.0–100.0)
Platelets: 274 10*3/uL (ref 150–400)
RBC: 5.48 MIL/uL (ref 4.22–5.81)
RDW: 12 % (ref 11.5–15.5)
WBC: 6.9 10*3/uL (ref 4.0–10.5)
nRBC: 0 % (ref 0.0–0.2)

## 2020-04-09 LAB — BASIC METABOLIC PANEL
Anion gap: 9 (ref 5–15)
BUN: 20 mg/dL (ref 6–20)
CO2: 23 mmol/L (ref 22–32)
Calcium: 9.1 mg/dL (ref 8.9–10.3)
Chloride: 107 mmol/L (ref 98–111)
Creatinine, Ser: 0.99 mg/dL (ref 0.61–1.24)
GFR calc Af Amer: >60 mL/min (ref >60)
GFR calc non Af Amer: >60 mL/min (ref >60)
Glucose, Bld: 110 mg/dL — ABNORMAL HIGH (ref 70–99)
Potassium: 4.1 mmol/L (ref 3.5–5.1)
Sodium: 139 mmol/L (ref 135–145)

## 2020-04-09 LAB — TROPONIN I (HIGH SENSITIVITY)
Troponin I (High Sensitivity): 4 ng/L (ref <18)
Troponin I (High Sensitivity): 4 ng/L (ref <18)

## 2020-04-09 LAB — MAGNESIUM: Magnesium: 2.2 mg/dL (ref 1.7–2.4)

## 2020-04-09 LAB — TSH: TSH: 1.166 u[IU]/mL (ref 0.350–4.500)

## 2020-04-09 MED ORDER — METOPROLOL TARTRATE 5 MG/5ML IV SOLN: 5.0000 mg | INTRAVENOUS | Status: DC | PRN

## 2020-04-09 MED ORDER — METOPROLOL TARTRATE 25 MG PO TABS
25.0000 mg | ORAL_TABLET | Freq: Once | ORAL | Status: AC
  Administered 2020-04-09: 25 mg via ORAL
  Filled 2020-04-09: qty 1

## 2020-04-09 MED ORDER — APIXABAN 5 MG PO TABS
5.0000 mg | ORAL_TABLET | Freq: Two times a day (BID) | ORAL | 0 refills | Status: DC
Start: 2020-04-09 — End: 2021-07-14

## 2020-04-09 MED ORDER — METOPROLOL TARTRATE 25 MG PO TABS
25.0000 mg | ORAL_TABLET | Freq: Two times a day (BID) | ORAL | 0 refills | Status: DC
Start: 2020-04-09 — End: 2021-07-14

## 2020-04-09 NOTE — ED Triage Notes (Signed)
Pt here for chest pain/palpitation that started last night around 2230.

## 2020-04-09 NOTE — ED Provider Notes (Addendum)
[facility] Emergency Department Provider Note   ____________________________________________   First MD Initiated Contact with Patient 04/09/20 1023     (approximate)  I have reviewed the triage vital signs and the nursing notes.   HISTORY  Chief Complaint Palpitations    HPI Nathaniel Gonzalez is a 54 y.o. male with past medical history of stroke, hypertension, and hyperlipidemia who presents to the ED complaining of palpitations.  Patient reports he initially had a stroke back in 2017 and has been dealing with intermittent palpitations ever since then.  He states his chest wall flutter for a few minutes at a time but then it seems to go away on its own.  He follows with cardiology due to his history of stroke and has previously been evaluated with Holter monitor, which has been unremarkable.  He developed similar palpitations last night around 1030, but when symptoms persisted into this morning he decided to be evaluated.  He states it feels like his breath catches when he goes to take a deep breath, but he denies any overt chest pain or shortness of breath.  He does not currently take blood thinners, denies any history of alcohol use or drug use.        Past Medical History:  Diagnosis Date  . Allergy   . Atypical mole 11/19/2019   right upper back 1.0cm lat to spine/excision  . Atypical mole 10/08/2019   right upper back 3.0cm lat to spine/mod, left upper back 1.0cm lat to spine/mild, left upper back 5.0cm lat to spine/mild  . Atypical mole 09/24/2019   right mid scapula/excision  . Atypical mole 07/01/2019   right mid bakc about 13cm lat to spine  . Atypical mole 07/21/2015   right lower back paraspinal  . Atypical mole 05/06/2014   right lower back paraspinal/mild  . Atypical mole 11/25/2013   right superior medial scapula/mild  . Chicken pox   . Medical history non-contributory   . Stroke [facility])     Patient Active Problem List    Diagnosis Date Noted  . Benign essential HTN 04/27/2017  . Hyperlipidemia, mixed 04/27/2017  . Palpitations 07/25/2016  . 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 Family history of colonic polyps 11/12/2008    Past Surgical History:  Procedure Laterality Date  . COLONOSCOPY  2011  . COLONOSCOPY WITH PROPOFOL N/A 07/19/2018   Procedure: COLONOSCOPY WITH PROPOFOL;  Surgeon: [name], MD;  Location: [facility] ENDOSCOPY;  Service: Gastroenterology;  Laterality: N/A;  . ESOPHAGOGASTRODUODENOSCOPY (EGD) WITH PROPOFOL N/A 07/19/2018   Procedure: ESOPHAGOGASTRODUODENOSCOPY (EGD) WITH PROPOFOL;  Surgeon: [name], MD;  Location: [facility] ENDOSCOPY;  Service: Gastroenterology;  Laterality: N/A;  . MOUTH SURGERY    . SKIN LESION EXCISION     several, he goes yearly  . TEE WITHOUT CARDIOVERSION N/A 08/17/2016   Procedure: TRANSESOPHAGEAL ECHOCARDIOGRAM (TEE);  Surgeon: Corey Skains, MD;  Location: [facility] ORS;  Service: Cardiovascular;  Laterality: N/A;    Prior to Admission medications   Medication Sig Start Date End Date Taking? Authorizing Provider  aspirin EC 325 MG EC tablet Take 1 tablet (325 mg total) by mouth daily. 07/08/16  Yes Max Sane, MD  atorvastatin (LIPITOR) 40 MG tablet TAKE 1 TABLET (40 MG TOTAL) BY MOUTH DAILY AT 6 PM. 08/05/19  Yes Jerrol Banana., MD  cetirizine (ZYRTEC) 10 MG tablet Take 10 mg by mouth daily.   Yes [provider]  Cholecalciferol (VITAMIN D-1000 MAX  ST) 1000 units tablet Take 1,000 Units by mouth daily.    Yes [provider]  fluticasone (FLONASE) 50 MCG/ACT nasal spray Place 2 sprays into both nostrils daily as needed for allergies or rhinitis.    Yes [provider]  verapamil (CALAN-SR) 240 MG CR tablet Take 1 tablet (240 mg total) by mouth at bedtime. 12/10/19  Yes Jerrol Banana., MD  apixaban (ELIQUIS) 5 MG TABS tablet Take 1 tablet (5 mg total) by mouth 2 (two) times daily. 04/09/20  05/09/20  Blake Divine, MD  metoprolol tartrate (LOPRESSOR) 25 MG tablet Take 1 tablet (25 mg total) by mouth 2 (two) times daily. 04/09/20 04/09/21  Blake Divine, MD    Allergies Erythromycin  Family History  Problem Relation Age of Onset  . Skin cancer Mother   . Colon polyps Mother   . Cancer Mother        skin, ureter cancer  . Heart attack Father   . COPD Father   . Aneurysm Father   . Colon polyps Father   . Heart disease Father   . Hypertension Brother   . Cancer Maternal Aunt        skin  . Cancer Maternal Grandmother        unknown type  . Heart disease Maternal Grandmother   . Cancer Paternal Grandmother        liver  . Heart disease Paternal Grandfather        died age 32 MI  . Cancer Cousin        skin    Social History Social History   Tobacco Use  . Smoking status: Never Smoker  . Smokeless tobacco: Never Used  Substance Use Topics  . Alcohol use: No  . Drug use: No    Review of Systems  Constitutional: No fever/chills Eyes: No visual changes. ENT: No sore throat. Cardiovascular: Denies chest pain.  Positive for palpitations. Respiratory: Denies shortness of breath. Gastrointestinal: No abdominal pain.  No nausea, no vomiting.  No diarrhea.  No constipation. Genitourinary: Negative for dysuria. Musculoskeletal: Negative for back pain. Skin: Negative for rash. Neurological: Negative for headaches, focal weakness or numbness.  ____________________________________________   PHYSICAL EXAM:  VITAL SIGNS: ED Triage Vitals  Enc Vitals Group     BP 04/09/20 0932 (!) 158/93     Pulse Rate 04/09/20 0932 (!) 122     Resp 04/09/20 0932 18     Temp 04/09/20 0932 98.3 F (36.8 C)     Temp Source 04/09/20 0932 Oral     SpO2 04/09/20 0932 96 %     Weight 04/09/20 0934 225 lb (102.1 kg)     Height 04/09/20 0934 5\' 10"  (1.778 m)     Head Circumference --      Peak Flow --      Pain Score 04/09/20 0934 0     Pain Loc --      Pain Edu? --       Excl. in [place]? --     Constitutional: Alert and oriented. Eyes: Conjunctivae are normal. Head: Atraumatic. Nose: No congestion/rhinnorhea. Mouth/Throat: Mucous membranes are moist. Neck: Normal ROM Cardiovascular: Normal rate, irregularly irregular rhythm. Grossly normal heart sounds. Respiratory: Normal respiratory effort.  No retractions. Lungs CTAB. Gastrointestinal: Soft and nontender. No distention. Genitourinary: deferred Musculoskeletal: No lower extremity tenderness nor edema. Neurologic:  Normal speech and language. No gross focal neurologic deficits are appreciated. Skin:  Skin is warm, dry and intact. No rash noted.  Psychiatric: Mood and affect are normal. Speech and behavior are normal.  ____________________________________________   LABS (all labs ordered are listed, but only abnormal results are displayed)  Labs Reviewed  BASIC METABOLIC PANEL - Abnormal; Notable for the following components:      Result Value   Glucose, Bld 110 (*)    All other components within normal limits  CBC - Abnormal; Notable for the following components:   MCHC 36.2 (*)    All other components within normal limits  MAGNESIUM  TSH  TROPONIN I (HIGH SENSITIVITY)  TROPONIN I (HIGH SENSITIVITY)   ____________________________________________  EKG  ED ECG REPORT I, Blake Divine, the attending physician, personally viewed and interpreted this ECG.   Date: 04/09/2020  EKG Time: 9:41  Rate: 89  Rhythm: atrial fibrillation, rate 89  Axis: Normal  Intervals:none  ST&T Change: None  ED ECG REPORT I, Blake Divine, the attending physician, personally viewed and interpreted this ECG.   Date: 05/21/2020  EKG Time: 14:28  Rate: 69  Rhythm: normal sinus rhythm  Axis: Normal  Intervals:none  ST&T Change: None    PROCEDURES  Procedure(s) performed (including Critical Care):  Procedures   ____________________________________________   INITIAL IMPRESSION / ASSESSMENT AND  PLAN / ED COURSE       54 year old male with history of stroke, hypertension, hyperlipidemia presents to the ED complaining of palpitations since 1030 last night.  EKG shows new onset atrial fibrillation with no ischemic changes, rate currently well controlled.  We will further assess with labs including troponin, TSH, CBC, BMP, and magnesium.  He denies any difficulty breathing, chest x-ray negative for acute process, no evidence of CHF.  He does have a CHA2DS2-VASc score of 3 and would likely benefit from anticoagulation.  Plan to discuss with cardiology.  Lab work unremarkable, case discussed with Dr. Nehemiah Massed of cardiology, who recommends starting patient on Eliquis as well as 25 mg metoprolol twice daily.  He may then follow-up in the cardiology clinic on Monday.  Patient was given initial dose of metoprolol here in the ED with conversion to normal sinus rhythm shortly afterwards.  Repeat EKG shows questionable ST elevation, however I doubt ACS as patient denies any chest pain, all symptoms have resolved following conversion to normal sinus rhythm.  2 sets of troponin are negative and remainder of labs are unremarkable.  He is appropriate for discharge home, was counseled to return to the ED for new or worsening symptoms.  Patient agrees with plan.      ____________________________________________   FINAL CLINICAL IMPRESSION(S) / ED DIAGNOSES  Final diagnoses:  Atrial fibrillation, unspecified type [facility])     ED Discharge Orders         Ordered    metoprolol tartrate (LOPRESSOR) 25 MG tablet  2 times daily     04/09/20 1436    apixaban (ELIQUIS) 5 MG TABS tablet  2 times daily     04/09/20 1436           Note:  This document was prepared using Dragon voice recognition software and may include unintentional dictation errors.   Blake Divine, MD 04/09/20 1439    Blake Divine, MD 05/21/20 1945

## 2020-04-09 NOTE — ED Notes (Signed)
EDP made aware that with standing pt's HR increases to 133. Pt back to bed without incident. Pt tolerating well.

## 2020-04-09 NOTE — Telephone Encounter (Signed)
Pt called in c/o a rapid heart rate that started last night after running up the stairs.   He is still having the rapid heart rate this morning with heaviness in his chest, sweating and clamminess.  His breathing is also shallow. He wanted to see if he could come into the office.   I let him know he would need to go to the ED but I would check with the office and see. I spoke with Jiles Garter at Dr. Alben Spittle office and she agreed he should go on to the ED.  Due to his stroke history and symptoms I instructed him to go on to the ED now.   He was agreeable and is going to [facility] now.  I sent my notes to Dr. Alben Spittle office.  Reason for Disposition . [1] Skipped or extra beat(s) AND [2] increases with exercise or exertion  Answer Assessment - Initial Assessment Questions 1. DESCRIPTION: "Please describe your heart rate or heart beat that you are having" (e.g., fast/slow, regular/irregular, skipped or extra beats, "palpitations")     11 PM last night it started beating fast.   I ran up the stairs then back down and then I walked up the stairs at that point it wasnot beating right.   I had a stroke in 2017.   They can't figure out why I had the stroke.    Sometimes I feel a skip.  This time the skipping has not goine away.   My resting pulse is 80. My Fitbit is 115 BPM this morning.   The nurse at work said it was 140/82 my BP.   After my stroke my BP is always 140/90s.    The nurse listened to my heart this morning and it's very irregular.   She suggested I call Dr. Rosanna Randy. 2. ONSET: "When did it start?" (Minutes, hours or days)      Last night 11:00 PM.   My breathing is a little more shallow than normal.   3. DURATION: "How long does it last" (e.g., seconds, minutes, hours)     Since 11:00 last night.    No radiation or pain any where.    4. PATTERN "Does it come and go, or has it been constant since it started?"  "Does it get worse with exertion?"   "Are you feeling it  now?"     Constant since last night. 5. TAP: "Using your hand, can you tap out what you are feeling on a chair or table in front of you, so that I can hear?" (Note: not all patients can do this)       115 this morning on my Fitbit and the nurse at work got the same reading close to it. 6. HEART RATE: "Can you tell me your heart rate?" "How many beats in 15 seconds?"  (Note: not all patients can do this)       115   Last night I was sweaty and clammy a little.   This morning I was a little sweaty. 7. RECURRENT SYMPTOM: "Have you ever had this before?" If so, ask: "When was the last time?" and "What happened that time?"      Never.    Just sometimes feels like a flutter but now it's non stop since 11:00 PM. 8. CAUSE: "What do you think is causing the palpitations?"     I feel a heaviness on top of my chest. 9. CARDIAC HISTORY: "Do you have any history of  heart disease?" (e.g., heart attack, angina, bypass surgery, angioplasty, arrhythmia)      No cardiac history.   Just had a stroke in 2017. 10. OTHER SYMPTOMS: "Do you have any other symptoms?" (e.g., dizziness, chest pain, sweating, difficulty breathing)       Sweaty, clammy, heaviness in top of my chest.  Shallow breathing.   11. PREGNANCY: "Is there any chance you are pregnant?" "When was your last menstrual period?"       N/A  Protocols used: [place]

## 2020-04-09 NOTE — ED Notes (Signed)
NAD noted at time of D/C. Pt denies questions or concerns. Pt ambulatory to the lobby at this time.  

## 2020-04-13 ENCOUNTER — Encounter: Payer: BC Managed Care – PPO | Admitting: Dermatology

## 2020-06-11 ENCOUNTER — Encounter: Payer: Self-pay | Admitting: Family Medicine

## 2020-06-13 ENCOUNTER — Other Ambulatory Visit: Payer: Self-pay | Admitting: Family Medicine

## 2020-06-13 DIAGNOSIS — I10 Essential (primary) hypertension: Secondary | ICD-10-CM

## 2020-06-13 NOTE — Telephone Encounter (Signed)
Requested Prescriptions  Pending Prescriptions Disp Refills  . verapamil (CALAN-SR) 240 MG CR tablet [Pharmacy Med Name: VERAPAMIL ER 240 MG TABLET] 90 tablet 0    Sig: TAKE 1 TABLET BY MOUTH AT BEDTIME.     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 [place]   In 1 month Ralene Bathe, MD [place] BP in normal range    BP Readings from Last 1 Encounters:  04/09/20 127/89

## 2020-06-16 NOTE — Progress Notes (Deleted)
Complete physical exam   Patient: Nathaniel Gonzalez   DOB: 10-26-1966   54 y.o. Male  MRN: [id number] Visit Date: 06/17/2020  I,Misti Towle S Charae Depaolis,acting as a scribe for Wilhemena Durie, MD.,have documented all relevant documentation on the behalf of Rawlin Reaume, MD,as directed by  Wilhemena Durie, MD while in the presence of Wilhemena Durie, MD.  Today's healthcare provider: Wilhemena Durie, MD   Chief Complaint  Patient presents with  . Annual Exam   Subjective    Nathaniel Gonzalez is a 54 y.o. male who presents today for a complete physical exam.  He reports consuming a general diet. The patient does not participate in regular exercise at present. He generally feels well. He reports sleeping well. He does not have additional problems to discuss today.  HPI  07/17/2018 PSA 1.0 07/19/2018 Colonoscopy-polyps x2 adenomas repeat in 10 years  Past Medical History:  Diagnosis Date  . Allergy   . Atypical mole 11/19/2019   right upper back 1.0cm lat to spine/excision  . Atypical mole 10/08/2019   right upper back 3.0cm lat to spine/mod, left upper back 1.0cm lat to spine/mild, left upper back 5.0cm lat to spine/mild  . Atypical mole 09/24/2019   right mid scapula/excision  . Atypical mole 07/01/2019   right mid bakc about 13cm lat to spine  . Atypical mole 07/21/2015   right lower back paraspinal  . Atypical mole 05/06/2014   right lower back paraspinal/mild  . Atypical mole 11/25/2013   right superior medial scapula/mild  . Chicken pox   . Medical history non-contributory   . Stroke [facility])    Past Surgical History:  Procedure Laterality Date  . COLONOSCOPY  2011  . COLONOSCOPY WITH PROPOFOL N/A 07/19/2018   Procedure: COLONOSCOPY WITH PROPOFOL;  Surgeon: [name], MD;  Location: [facility] ENDOSCOPY;  Service: Gastroenterology;  Laterality: N/A;  . ESOPHAGOGASTRODUODENOSCOPY (EGD) WITH PROPOFOL N/A 07/19/2018   Procedure:  ESOPHAGOGASTRODUODENOSCOPY (EGD) WITH PROPOFOL;  Surgeon: [name], MD;  Location: [facility] ENDOSCOPY;  Service: Gastroenterology;  Laterality: N/A;  . MOUTH SURGERY    . SKIN LESION EXCISION     several, he goes yearly  . TEE WITHOUT CARDIOVERSION N/A 08/17/2016   Procedure: TRANSESOPHAGEAL ECHOCARDIOGRAM (TEE);  Surgeon: Corey Skains, MD;  Location: [facility] ORS;  Service: Cardiovascular;  Laterality: N/A;   Social History   Socioeconomic History  . Marital status: Married    Spouse name: Not on file  . Number of children: Not on file  . Years of education: Not on file  . Highest education level: Not on file  Occupational History  . Not on file  Tobacco Use  . Smoking status: Never Smoker  . Smokeless tobacco: Never Used  Vaping Use  . Vaping Use: Never used  Substance and Sexual Activity  . Alcohol use: No  . Drug use: No  . Sexual activity: Not on file  Other Topics Concern  . Not on file  Social History Narrative  . Not on file   Social Determinants of Health   Financial Resource Strain:   . Difficulty of Paying Living Expenses:   Food Insecurity:   . Worried About Charity fundraiser in the Last Year:   . Arboriculturist in the Last Year:   Transportation Needs:   . Film/video editor (Medical):   Marland Kitchen Lack of Transportation (Non-Medical):   Physical Activity:   . Days of Exercise per Week:   .  Minutes of Exercise per Session:   Stress:   . Feeling of Stress :   Social Connections:   . Frequency of Communication with Friends and Family:   . Frequency of Social Gatherings with Friends and Family:   . Attends Religious Services:   . Active Member of Clubs or Organizations:   . Attends Archivist Meetings:   Marland Kitchen Marital Status:   Intimate Partner Violence:   . Fear of Current or Ex-Partner:   . Emotionally Abused:   Marland Kitchen Physically Abused:   . Sexually Abused:    Family Status  Relation Name Status  . Mother  Deceased  . Father  Alive  . Brother   Alive  . Brother  Alive  . Daughter  Alive  . Son  Alive  . Mat Aunt  Deceased  . MGM  Deceased  . MGF  Deceased  . PGM  Deceased  . PGF  Deceased  . Cousin  Alive   Family History  Problem Relation Age of Onset  . Skin cancer Mother   . Colon polyps Mother   . Cancer Mother        skin, ureter cancer  . Heart attack Father   . COPD Father   . Aneurysm Father   . Colon polyps Father   . Heart disease Father   . Hypertension Brother   . Cancer Maternal Aunt        skin  . Cancer Maternal Grandmother        unknown type  . Heart disease Maternal Grandmother   . Cancer Paternal Grandmother        liver  . Heart disease Paternal Grandfather        died age 21 MI  . Cancer Cousin        skin   Allergies  Allergen Reactions  . Erythromycin Hives    Childhood allergy     Patient Care Team: Jerrol Banana., MD as PCP - General (Family Medicine) Vladimir Crofts, MD as Consulting Physician (Neurology) Corey Skains, MD as Consulting Physician (Cardiology)   Medications: Outpatient Medications Prior to Visit  Medication Sig  . apixaban (ELIQUIS) 5 MG TABS tablet Take 1 tablet (5 mg total) by mouth 2 (two) times daily.  Marland Kitchen atorvastatin (LIPITOR) 40 MG tablet TAKE 1 TABLET (40 MG TOTAL) BY MOUTH DAILY AT 6 PM.  . cetirizine (ZYRTEC) 10 MG tablet Take 10 mg by mouth daily.  . Cholecalciferol (VITAMIN D-1000 MAX ST) 1000 units tablet Take 1,000 Units by mouth daily.   . metoprolol tartrate (LOPRESSOR) 25 MG tablet Take 1 tablet (25 mg total) by mouth 2 (two) times daily.  . verapamil (CALAN-SR) 240 MG CR tablet TAKE 1 TABLET BY MOUTH AT BEDTIME.  . [DISCONTINUED] aspirin EC 325 MG EC tablet Take 1 tablet (325 mg total) by mouth daily. (Patient not taking: Reported on 06/17/2020)  . [DISCONTINUED] fluticasone (FLONASE) 50 MCG/ACT nasal spray Place 2 sprays into both nostrils daily as needed for allergies or rhinitis.  (Patient not taking: Reported on 06/17/2020)   No  facility-administered medications prior to visit.    Review of Systems  Constitutional: Negative.   HENT: Positive for rhinorrhea and sneezing.   Eyes: Negative.   Respiratory: Negative.   Cardiovascular: Positive for palpitations.  Gastrointestinal: Negative.   Endocrine: Negative.   Genitourinary: Negative.   Musculoskeletal: Negative.   Skin: Negative.   Allergic/Immunologic: Negative.   Neurological: Positive for numbness.  Hematological:  Negative.   Psychiatric/Behavioral: Negative.     Last CBC Lab Results  Component Value Date   WBC 6.9 04/09/2020   HGB 16.7 04/09/2020   HCT 46.1 04/09/2020   MCV 84.1 04/09/2020   MCH 30.5 04/09/2020   RDW 12.0 04/09/2020   PLT 274 01/60/1093   Last metabolic panel Lab Results  Component Value Date   GLUCOSE 110 (H) 04/09/2020   NA 139 04/09/2020   K 4.1 04/09/2020   CL 107 04/09/2020   CO2 23 04/09/2020   BUN 20 04/09/2020   CREATININE 0.99 04/09/2020   GFRNONAA >60 04/09/2020   GFRAA >60 04/09/2020   CALCIUM 9.1 04/09/2020   PROT 6.5 06/17/2019   ALBUMIN 4.2 06/17/2019   LABGLOB 2.3 06/17/2019   AGRATIO 1.8 06/17/2019   BILITOT 1.0 06/17/2019   ALKPHOS 79 06/17/2019   AST 15 06/17/2019   ALT 24 06/17/2019   ANIONGAP 9 04/09/2020   Last lipids Lab Results  Component Value Date   CHOL 121 06/17/2019   HDL 35 (L) 06/17/2019   LDLCALC 53 06/17/2019   TRIG 165 (H) 06/17/2019   CHOLHDL 3.5 06/17/2019   Last thyroid functions Lab Results  Component Value Date   TSH 1.166 04/09/2020      Objective    BP 128/79 (BP Location: Right Arm, Patient Position: Sitting, Cuff Size: Large)   Pulse (!) 56   Temp (!) 97.3 F (36.3 C) (Temporal)   Resp 16   Ht 5\' 10"  (1.778 m)   Wt 224 lb 12.8 oz (102 kg)   BMI 32.26 kg/m  BP Readings from Last 3 Encounters:  06/17/20 128/79  04/09/20 127/89  12/10/19 (!) 148/87   Wt Readings from Last 3 Encounters:  06/17/20 224 lb 12.8 oz (102 kg)  04/09/20 225 lb (102.1 kg)   12/10/19 225 lb 9.6 oz (102.3 kg)      Physical Exam  ***  Last depression screening scores PHQ 2/9 Scores 06/17/2020 06/17/2019 06/13/2018  PHQ - 2 Score 0 0 1  PHQ- 9 Score 0 0 2   Last fall risk screening Fall Risk  06/17/2020  Falls in the past year? 0  Number falls in past yr: 0  Injury with Fall? 0  Risk for fall due to : No Fall Risks  Follow up Falls evaluation completed   Last Audit-C alcohol use screening Alcohol Use Disorder Test (AUDIT) 06/17/2020  1. How often do you have a drink containing alcohol? 0  2. How many drinks containing alcohol do you have on a typical day when you are drinking? 0  3. How often do you have six or more drinks on one occasion? 0  AUDIT-C Score 0  Alcohol Brief Interventions/Follow-up AUDIT Score <7 follow-up not indicated   A score of 3 or more in women, and 4 or more in men indicates increased risk for alcohol abuse, EXCEPT if all of the points are from question 1   No results found for any visits on 06/17/20.  Assessment & Plan    Routine Health Maintenance and Physical Exam  Exercise Activities and Dietary recommendations Goals   None     Immunization History  Administered Date(s) Administered  . Influenza,inj,Quad PF,6+ Mos 08/13/2019  . Td 06/17/2019  . Tdap 11/12/2008    Health Maintenance  Topic Date Due  . Hepatitis C Screening  Never done  . COVID-19 Vaccine (1) Never done  . INFLUENZA VACCINE  07/05/2020  . COLONOSCOPY  07/20/2023  . TETANUS/TDAP  06/16/2029  . HIV Screening  Completed    Discussed health benefits of physical activity, and encouraged him to engage in regular exercise appropriate for his age and condition.  ***  No follow-ups on file.     {provider attestation***:1}   Wilhemena Durie, MD  [facility] [phone] (phone) [phone] (fax)  [place]

## 2020-06-17 ENCOUNTER — Encounter: Payer: Self-pay | Admitting: Family Medicine

## 2020-06-17 ENCOUNTER — Ambulatory Visit (INDEPENDENT_AMBULATORY_CARE_PROVIDER_SITE_OTHER): Payer: BC Managed Care – PPO | Admitting: Family Medicine

## 2020-06-17 ENCOUNTER — Other Ambulatory Visit: Payer: Self-pay

## 2020-06-17 VITALS — BP 128/79 | HR 56 | Temp 97.3°F | Resp 16 | Ht 70.0 in | Wt 224.8 lb

## 2020-06-17 DIAGNOSIS — Z1159 Encounter for screening for other viral diseases: Secondary | ICD-10-CM

## 2020-06-17 DIAGNOSIS — I63119 Cerebral infarction due to embolism of unspecified vertebral artery: Secondary | ICD-10-CM

## 2020-06-17 DIAGNOSIS — Z125 Encounter for screening for malignant neoplasm of prostate: Secondary | ICD-10-CM

## 2020-06-17 DIAGNOSIS — I10 Essential (primary) hypertension: Secondary | ICD-10-CM

## 2020-06-17 DIAGNOSIS — Z0000 Encounter for general adult medical examination without abnormal findings: Secondary | ICD-10-CM | POA: Diagnosis not present

## 2020-06-17 DIAGNOSIS — E782 Mixed hyperlipidemia: Secondary | ICD-10-CM

## 2020-06-17 DIAGNOSIS — I480 Paroxysmal atrial fibrillation: Secondary | ICD-10-CM

## 2020-06-17 LAB — POCT URINALYSIS DIPSTICK
Bilirubin, UA: NEGATIVE
Blood, UA: NEGATIVE
Glucose, UA: NEGATIVE
Ketones, UA: NEGATIVE
Leukocytes, UA: NEGATIVE
Nitrite, UA: NEGATIVE
Protein, UA: NEGATIVE
Spec Grav, UA: 1.025 (ref 1.010–1.025)
Urobilinogen, UA: 0.2 E.U./dL
pH, UA: 6 (ref 5.0–8.0)

## 2020-06-17 NOTE — Progress Notes (Deleted)
Established Patient Office Visit  Subjective:  Patient ID: Nathaniel Gonzalez, male    DOB: 1966/09/03  Age: 54 y.o. MRN: [id number]  CC: No chief complaint on file.   HPI Nathaniel Gonzalez presents for ***  Past Medical History:  Diagnosis Date   Allergy    Atypical mole 11/19/2019   right upper back 1.0cm lat to spine/excision   Atypical mole 10/08/2019   right upper back 3.0cm lat to spine/mod, left upper back 1.0cm lat to spine/mild, left upper back 5.0cm lat to spine/mild   Atypical mole 09/24/2019   right mid scapula/excision   Atypical mole 07/01/2019   right mid bakc about 13cm lat to spine   Atypical mole 07/21/2015   right lower back paraspinal   Atypical mole 05/06/2014   right lower back paraspinal/mild   Atypical mole 11/25/2013   right superior medial scapula/mild   Chicken pox    Medical history non-contributory    Stroke [facility])     Past Surgical History:  Procedure Laterality Date   COLONOSCOPY  2011   COLONOSCOPY WITH PROPOFOL N/A 07/19/2018   Procedure: COLONOSCOPY WITH PROPOFOL;  Surgeon: [name], MD;  Location: [facility] ENDOSCOPY;  Service: Gastroenterology;  Laterality: N/A;   ESOPHAGOGASTRODUODENOSCOPY (EGD) WITH PROPOFOL N/A 07/19/2018   Procedure: ESOPHAGOGASTRODUODENOSCOPY (EGD) WITH PROPOFOL;  Surgeon: [name], MD;  Location: [facility] ENDOSCOPY;  Service: Gastroenterology;  Laterality: N/A;   MOUTH SURGERY     SKIN LESION EXCISION     several, he goes yearly   TEE WITHOUT CARDIOVERSION N/A 08/17/2016   Procedure: TRANSESOPHAGEAL ECHOCARDIOGRAM (TEE);  Surgeon: Corey Skains, MD;  Location: [facility] ORS;  Service: Cardiovascular;  Laterality: N/A;    Family History  Problem Relation Age of Onset   Skin cancer Mother    Colon polyps Mother    Cancer Mother        skin, ureter cancer   Heart attack Father    COPD Father    Aneurysm Father    Colon polyps Father    Heart disease Father    Hypertension  Brother    Cancer Maternal Aunt        skin   Cancer Maternal Grandmother        unknown type   Heart disease Maternal Grandmother    Cancer Paternal Grandmother        liver   Heart disease Paternal Grandfather        died age 61 MI   Cancer Cousin        skin    Social History   Socioeconomic History   Marital status: Married    Spouse name: Not on file   Number of children: Not on file   Years of education: Not on file   Highest education level: Not on file  Occupational History   Not on file  Tobacco Use   Smoking status: Never Smoker   Smokeless tobacco: Never Used  Vaping Use   Vaping Use: Never used  Substance and Sexual Activity   Alcohol use: No   Drug use: No   Sexual activity: Not on file  Other Topics Concern   Not on file  Social History Narrative   Not on file   Social Determinants of Health   Financial Resource Strain:    Difficulty of Paying Living Expenses:   Food Insecurity:    Worried About Running Out of Food in the Last Year:    College in the Last  Year:   Transportation Needs:    Film/video editor (Medical):    Lack of Transportation (Non-Medical):   Physical Activity:    Days of Exercise per Week:    Minutes of Exercise per Session:   Stress:    Feeling of Stress :   Social Connections:    Frequency of Communication with Friends and Family:    Frequency of Social Gatherings with Friends and Family:    Attends Religious Services:    Active Member of Clubs or Organizations:    Attends Music therapist:    Marital Status:   Intimate Partner Violence:    Fear of Current or Ex-Partner:    Emotionally Abused:    Physically Abused:    Sexually Abused:     Outpatient Medications Prior to Visit  Medication Sig Dispense Refill   apixaban (ELIQUIS) 5 MG TABS tablet Take 1 tablet (5 mg total) by mouth 2 (two) times daily. 60 tablet 0   aspirin EC 325 MG EC tablet Take 1  tablet (325 mg total) by mouth daily. 30 tablet 0   atorvastatin (LIPITOR) 40 MG tablet TAKE 1 TABLET (40 MG TOTAL) BY MOUTH DAILY AT 6 PM. 90 tablet 3   cetirizine (ZYRTEC) 10 MG tablet Take 10 mg by mouth daily.     Cholecalciferol (VITAMIN D-1000 MAX ST) 1000 units tablet Take 1,000 Units by mouth daily.      fluticasone (FLONASE) 50 MCG/ACT nasal spray Place 2 sprays into both nostrils daily as needed for allergies or rhinitis.      metoprolol tartrate (LOPRESSOR) 25 MG tablet Take 1 tablet (25 mg total) by mouth 2 (two) times daily. 60 tablet 0   verapamil (CALAN-SR) 240 MG CR tablet TAKE 1 TABLET BY MOUTH AT BEDTIME. 90 tablet 0   No facility-administered medications prior to visit.    Allergies  Allergen Reactions   Erythromycin Hives    Childhood allergy     ROS Review of Systems    Objective:    Physical Exam  There were no vitals taken for this visit. Wt Readings from Last 3 Encounters:  04/09/20 225 lb (102.1 kg)  12/10/19 225 lb 9.6 oz (102.3 kg)  06/17/19 223 lb 3.2 oz (101.2 kg)     Health Maintenance Due  Topic Date Due   Hepatitis C Screening  Never done   COVID-19 Vaccine (1) Never done    There are no preventive care reminders to display for this patient.  Lab Results  Component Value Date   TSH 1.166 04/09/2020   Lab Results  Component Value Date   WBC 6.9 04/09/2020   HGB 16.7 04/09/2020   HCT 46.1 04/09/2020   MCV 84.1 04/09/2020   PLT 274 04/09/2020   Lab Results  Component Value Date   NA 139 04/09/2020   K 4.1 04/09/2020   CO2 23 04/09/2020   GLUCOSE 110 (H) 04/09/2020   BUN 20 04/09/2020   CREATININE 0.99 04/09/2020   BILITOT 1.0 06/17/2019   ALKPHOS 79 06/17/2019   AST 15 06/17/2019   ALT 24 06/17/2019   PROT 6.5 06/17/2019   ALBUMIN 4.2 06/17/2019   CALCIUM 9.1 04/09/2020   ANIONGAP 9 04/09/2020   Lab Results  Component Value Date   CHOL 121 06/17/2019   Lab Results  Component Value Date   HDL 35 (L)  06/17/2019   Lab Results  Component Value Date   LDLCALC 53 06/17/2019   Lab Results  Component Value Date  TRIG 165 (H) 06/17/2019   Lab Results  Component Value Date   CHOLHDL 3.5 06/17/2019   Lab Results  Component Value Date   HGBA1C 4.8 07/06/2016      Assessment & Plan:   Problem List Items Addressed This Visit    None      No orders of the defined types were placed in this encounter.   Follow-up: No follow-ups on file.    Bruna Potter, Medical Student

## 2020-06-17 NOTE — Progress Notes (Addendum)
Complete physical exam   Patient: Nathaniel Gonzalez   DOB: Mar 24, 1966   54 y.o. Male  MRN: [id number] Visit Date: 06/17/2020  I,Sulibeya S Dimas,acting as a scribe for Wilhemena Durie, MD.,have documented all relevant documentation on the behalf of Jaterrius Ricketson, MD,as directed by  Wilhemena Durie, MD while in the presence of Wilhemena Durie, MD.  Today's healthcare provider: Wilhemena Durie, MD   Chief Complaint  Patient presents with   Annual Exam   Subjective    Nathaniel Gonzalez is a 54 y.o. male who presents today for a complete physical exam.  He reports consuming a general diet. The patient does not participate in regular exercise at present. He generally feels well. He reports sleeping well. He does not have additional problems to discuss today.   In early May Nathaniel Gonzalez presented to the ED and was found to have atrial fibrillation; his only symptom was heart palpitations. He was prescribed metoprolol and abixaban at that time and was instructed to take 2 metoprolol if he has additional symptoms. He has followed up his cardiologist and neurologist. Since then, he has had a handful of episodes of palpitations, with no other symptoms. All of these episodes have resolved within 15 minutes and he has not taken any medications.  He has also had several instances of difficulty getting a full breath. He notices this when at rest, such as when he is watching TV. These episodes are not happening at the same time as the palpitations.  His weight was up to 224 today from 217, which he attributes to being outside of the athletic seasons and school year and foot pain for the last month. His goal is to get to 200 lbs in the next year.   07/17/2018 PSA 1.0 07/19/2018 Colonoscopy-polyps x2 adenomas repeat in 10 years  Past Medical History:  Diagnosis Date   Allergy    Atypical mole 11/19/2019   right upper back 1.0cm lat to spine/excision   Atypical  mole 10/08/2019   right upper back 3.0cm lat to spine/mod, left upper back 1.0cm lat to spine/mild, left upper back 5.0cm lat to spine/mild   Atypical mole 09/24/2019   right mid scapula/excision   Atypical mole 07/01/2019   right mid bakc about 13cm lat to spine   Atypical mole 07/21/2015   right lower back paraspinal   Atypical mole 05/06/2014   right lower back paraspinal/mild   Atypical mole 11/25/2013   right superior medial scapula/mild   Chicken pox    Medical history non-contributory    Stroke [facility])    Past Surgical History:  Procedure Laterality Date   COLONOSCOPY  2011   COLONOSCOPY WITH PROPOFOL N/A 07/19/2018   Procedure: COLONOSCOPY WITH PROPOFOL;  Surgeon: [name], MD;  Location: [facility] ENDOSCOPY;  Service: Gastroenterology;  Laterality: N/A;   ESOPHAGOGASTRODUODENOSCOPY (EGD) WITH PROPOFOL N/A 07/19/2018   Procedure: ESOPHAGOGASTRODUODENOSCOPY (EGD) WITH PROPOFOL;  Surgeon: [name], MD;  Location: [facility] ENDOSCOPY;  Service: Gastroenterology;  Laterality: N/A;   MOUTH SURGERY     SKIN LESION EXCISION     several, he goes yearly   TEE WITHOUT CARDIOVERSION N/A 08/17/2016   Procedure: TRANSESOPHAGEAL ECHOCARDIOGRAM (TEE);  Surgeon: Corey Skains, MD;  Location: [facility] ORS;  Service: Cardiovascular;  Laterality: N/A;   Social History   Socioeconomic History   Marital status: Married    Spouse name: Not on file   Number of children: Not on file   Years  of education: Not on file   Highest education level: Not on file  Occupational History   Not on file  Tobacco Use   Smoking status: Never Smoker   Smokeless tobacco: Never Used  Vaping Use   Vaping Use: Never used  Substance and Sexual Activity   Alcohol use: No   Drug use: No   Sexual activity: Not on file  Other Topics Concern   Not on file  Social History Narrative   Not on file   Social Determinants of Health   Financial Resource Strain:    Difficulty of Paying Living  Expenses:   Food Insecurity:    Worried About Charity fundraiser in the Last Year:    Arboriculturist in the Last Year:   Transportation Needs:    Film/video editor (Medical):    Lack of Transportation (Non-Medical):   Physical Activity:    Days of Exercise per Week:    Minutes of Exercise per Session:   Stress:    Feeling of Stress :   Social Connections:    Frequency of Communication with Friends and Family:    Frequency of Social Gatherings with Friends and Family:    Attends Religious Services:    Active Member of Clubs or Organizations:    Attends Music therapist:    Marital Status:   Intimate Partner Violence:    Fear of Current or Ex-Partner:    Emotionally Abused:    Physically Abused:    Sexually Abused:    Family Status  Relation Name Status   Mother  Deceased   Father  Alive   Brother  Alive   Brother  Alive   Daughter  Alive   Son  Alive   Mat Aunt  Deceased   MGM  Deceased   MGF  Deceased   PGM  Deceased   PGF  Deceased   Cousin  Alive   Family History  Problem Relation Age of Onset   Skin cancer Mother    Colon polyps Mother    Cancer Mother        skin, ureter cancer   Heart attack Father    COPD Father    Aneurysm Father    Colon polyps Father    Heart disease Father    Hypertension Brother    Cancer Maternal Aunt        skin   Cancer Maternal Grandmother        unknown type   Heart disease Maternal Grandmother    Cancer Paternal Grandmother        liver   Heart disease Paternal Grandfather        died age 51 MI   Cancer Cousin        skin   Allergies  Allergen Reactions   Erythromycin Hives    Childhood allergy     Patient Care Team: Jerrol Banana., MD as PCP - General (Family Medicine) Vladimir Crofts, MD as Consulting Physician (Neurology) Corey Skains, MD as Consulting Physician (Cardiology)   Medications: Outpatient Medications Prior to Visit    Medication Sig   apixaban (ELIQUIS) 5 MG TABS tablet Take 1 tablet (5 mg total) by mouth 2 (two) times daily.   atorvastatin (LIPITOR) 40 MG tablet TAKE 1 TABLET (40 MG TOTAL) BY MOUTH DAILY AT 6 PM.   cetirizine (ZYRTEC) 10 MG tablet Take 10 mg by mouth daily.   Cholecalciferol (VITAMIN D-1000 MAX ST) 1000 units  tablet Take 1,000 Units by mouth daily.    metoprolol tartrate (LOPRESSOR) 25 MG tablet Take 1 tablet (25 mg total) by mouth 2 (two) times daily.   verapamil (CALAN-SR) 240 MG CR tablet TAKE 1 TABLET BY MOUTH AT BEDTIME.   [DISCONTINUED] aspirin EC 325 MG EC tablet Take 1 tablet (325 mg total) by mouth daily. (Patient not taking: Reported on 06/17/2020)   [DISCONTINUED] fluticasone (FLONASE) 50 MCG/ACT nasal spray Place 2 sprays into both nostrils daily as needed for allergies or rhinitis.  (Patient not taking: Reported on 06/17/2020)   No facility-administered medications prior to visit.    Review of Systems  Constitutional: Negative.   HENT: Positive for rhinorrhea and sneezing.   Eyes: Negative.   Respiratory: Negative.   Cardiovascular: Positive for palpitations.  Gastrointestinal: Negative.   Endocrine: Negative.   Genitourinary: Negative.   Musculoskeletal: Negative.   Skin: Negative.   Neurological: Positive for numbness.       He has some abnormal sensation in the lower right arm, he reports his sensation has improved in the time since his stroke.  Hematological: Negative.   Psychiatric/Behavioral: Negative.     Last CBC Lab Results  Component Value Date   WBC 6.9 04/09/2020   HGB 16.7 04/09/2020   HCT 46.1 04/09/2020   MCV 84.1 04/09/2020   MCH 30.5 04/09/2020   RDW 12.0 04/09/2020   PLT 274 95/08/3266   Last metabolic panel Lab Results  Component Value Date   GLUCOSE 110 (H) 04/09/2020   NA 139 04/09/2020   K 4.1 04/09/2020   CL 107 04/09/2020   CO2 23 04/09/2020   BUN 20 04/09/2020   CREATININE 0.99 04/09/2020   GFRNONAA >60 04/09/2020    GFRAA >60 04/09/2020   CALCIUM 9.1 04/09/2020   PROT 6.5 06/17/2019   ALBUMIN 4.2 06/17/2019   LABGLOB 2.3 06/17/2019   AGRATIO 1.8 06/17/2019   BILITOT 1.0 06/17/2019   ALKPHOS 79 06/17/2019   AST 15 06/17/2019   ALT 24 06/17/2019   ANIONGAP 9 04/09/2020   Last lipids Lab Results  Component Value Date   CHOL 121 06/17/2019   HDL 35 (L) 06/17/2019   LDLCALC 53 06/17/2019   TRIG 165 (H) 06/17/2019   CHOLHDL 3.5 06/17/2019   Last thyroid functions Lab Results  Component Value Date   TSH 1.166 04/09/2020      Objective    BP 128/79 (BP Location: Right Arm, Patient Position: Sitting, Cuff Size: Large)    Pulse (!) 56    Temp (!) 97.3 F (36.3 C) (Temporal)    Resp 16    Ht 5\' 10"  (1.778 m)    Wt 224 lb 12.8 oz (102 kg)    BMI 32.26 kg/m  BP Readings from Last 3 Encounters:  06/17/20 128/79  04/09/20 127/89  12/10/19 (!) 148/87   Wt Readings from Last 3 Encounters:  06/17/20 224 lb 12.8 oz (102 kg)  04/09/20 225 lb (102.1 kg)  12/10/19 225 lb 9.6 oz (102.3 kg)      Physical Exam Constitutional:      General: He is not in acute distress.    Appearance: Normal appearance. He is normal weight. He is not ill-appearing, toxic-appearing or diaphoretic.     Comments: Pleasant individual, cooperative with exam.  HENT:     Head: Normocephalic and atraumatic.     Right Ear: External ear normal.     Left Ear: External ear normal.     Nose: Nose normal.  Mouth/Throat:     Mouth: Mucous membranes are moist.  Eyes:     General: No scleral icterus.    Conjunctiva/sclera: Conjunctivae normal.  Cardiovascular:     Rate and Rhythm: Normal rate and regular rhythm.     Heart sounds: Normal heart sounds.  Pulmonary:     Effort: Pulmonary effort is normal. No respiratory distress.     Breath sounds: Normal breath sounds.  Abdominal:     Palpations: Abdomen is soft.     Tenderness: There is no abdominal tenderness. There is no guarding.  Genitourinary:    Penis: Normal.        Testes: Normal.  Musculoskeletal:        General: Normal range of motion.     Cervical back: Neck supple.  Skin:    General: Skin is warm and dry.     Coloration: Skin is not jaundiced.     Findings: No erythema.  Neurological:     Mental Status: He is alert and oriented to person, place, and time. Mental status is at baseline.     Comments: Some residual sensory defects in right fingers  Psychiatric:        Mood and Affect: Mood normal.        Behavior: Behavior normal.        Thought Content: Thought content normal.        Judgment: Judgment normal.      Last depression screening scores PHQ 2/9 Scores 06/17/2020 06/17/2019 06/13/2018  PHQ - 2 Score 0 0 1  PHQ- 9 Score 0 0 2   Last fall risk screening Fall Risk  06/17/2020  Falls in the past year? 0  Number falls in past yr: 0  Injury with Fall? 0  Risk for fall due to : No Fall Risks  Follow up Falls evaluation completed   Last Audit-C alcohol use screening Alcohol Use Disorder Test (AUDIT) 06/17/2020  1. How often do you have a drink containing alcohol? 0  2. How many drinks containing alcohol do you have on a typical day when you are drinking? 0  3. How often do you have six or more drinks on one occasion? 0  AUDIT-C Score 0  Alcohol Brief Interventions/Follow-up AUDIT Score <7 follow-up not indicated   A score of 3 or more in women, and 4 or more in men indicates increased risk for alcohol abuse, EXCEPT if all of the points are from question 1   Results for orders placed or performed in visit on 06/17/20  POCT urinalysis dipstick  Result Value Ref Range   Color, UA yellow    Clarity, UA clear    Glucose, UA Negative Negative   Bilirubin, UA Negative    Ketones, UA Negative    Spec Grav, UA 1.025 1.010 - 1.025   Blood, UA Negative    pH, UA 6.0 5.0 - 8.0   Protein, UA Negative Negative   Urobilinogen, UA 0.2 0.2 or 1.0 E.U./dL   Nitrite, UA Negative    Leukocytes, UA Negative Negative    Assessment & Plan     Routine Health Maintenance and Physical Exam  Nathaniel Gonzalez mentioned his goal weight is 200 lbs, Dr. Rosanna Randy recommended 210 would be a good weight, with the goal of minimizing belly fat.   Exercise Activities and Dietary recommendations Goals   None     Immunization History  Administered Date(s) Administered   Influenza,inj,Quad PF,6+ Mos 08/13/2019   Td 06/17/2019   Tdap 11/12/2008  Health Maintenance  Topic Date Due   Hepatitis C Screening  Never done   COVID-19 Vaccine (1) Never done   INFLUENZA VACCINE  07/05/2020   COLONOSCOPY  07/20/2023   TETANUS/TDAP  06/16/2029   HIV Screening  Completed    Discussed health benefits of physical activity, and encouraged him to engage in regular exercise appropriate for his age and condition. 1. Annual physical exam  - CBC with Differential/Platelet - Comprehensive metabolic panel - Lipid Panel With LDL/HDL Ratio - Hepatitis C antibody - PSA - TSH - POCT urinalysis dipstick  2. Benign essential HTN  - CBC with Differential/Platelet - Comprehensive metabolic panel  3. Hyperlipidemia, mixed  - Lipid Panel With LDL/HDL Ratio  4. Prostate cancer screening  - PSA  5. 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 Paroxysmal atrial fibrillation ([place]) Certainly this was a source of his stroke several years ago.  Continue apixaban (elequis) and metoprolol (lopressor) as prescribed. Continue atorvastatin, cholecalcipherol, and verapamil. Continue cetirizine for allergies.  No follow-ups on file.     Rodrigo Ran, Medical Student    Wilhemena Durie, [place] [phone] (phone) [phone] (fax)  [place]

## 2020-06-17 NOTE — Patient Instructions (Signed)
Preventive Care 41-53 Years Old, Male Preventive care refers to lifestyle choices and visits with your health care provider that can promote health and wellness. This includes:  A yearly physical exam. This is also called an annual well check.  Regular dental and eye exams.  Immunizations.  Screening for certain conditions.  Healthy lifestyle choices, such as eating a healthy diet, getting regular exercise, not using drugs or products that contain nicotine and tobacco, and limiting alcohol use. What can I expect for my preventive care visit? Physical exam Your health care provider will check:  Height and weight. These may be used to calculate body mass index (BMI), which is a measurement that tells if you are at a healthy weight.  Heart rate and blood pressure.  Your skin for abnormal spots. Counseling Your health care provider may ask you questions about:  Alcohol, tobacco, and drug use.  Emotional well-being.  Home and relationship well-being.  Sexual activity.  Eating habits.  Work and work Statistician. What immunizations do I need?  Influenza (flu) vaccine  This is recommended every year. Tetanus, diphtheria, and pertussis (Tdap) vaccine  You may need a Td booster every 10 years. Varicella (chickenpox) vaccine  You may need this vaccine if you have not already been vaccinated. Zoster (shingles) vaccine  You may need this after age 64. Measles, mumps, and rubella (MMR) vaccine  You may need at least one dose of MMR if you were born in 1957 or later. You may also need a second dose. Pneumococcal conjugate (PCV13) vaccine  You may need this if you have certain conditions and were not previously vaccinated. Pneumococcal polysaccharide (PPSV23) vaccine  You may need one or two doses if you smoke cigarettes or if you have certain conditions. Meningococcal conjugate (MenACWY) vaccine  You may need this if you have certain conditions. Hepatitis A  vaccine  You may need this if you have certain conditions or if you travel or work in places where you may be exposed to hepatitis A. Hepatitis B vaccine  You may need this if you have certain conditions or if you travel or work in places where you may be exposed to hepatitis B. Haemophilus influenzae type b (Hib) vaccine  You may need this if you have certain risk factors. Human papillomavirus (HPV) vaccine  If recommended by your health care provider, you may need three doses over 6 months. You may receive vaccines as individual doses or as more than one vaccine together in one shot (combination vaccines). Talk with your health care provider about the risks and benefits of combination vaccines. What tests do I need? Blood tests  Lipid and cholesterol levels. These may be checked every 5 years, or more frequently if you are over 60 years old.  Hepatitis C test.  Hepatitis B test. Screening  Lung cancer screening. You may have this screening every year starting at age 43 if you have a 30-pack-year history of smoking and currently smoke or have quit within the past 15 years.  Prostate cancer screening. Recommendations will vary depending on your family history and other risks.  Colorectal cancer screening. All adults should have this screening starting at age 72 and continuing until age 2. Your health care provider may recommend screening at age 14 if you are at increased risk. You will have tests every 1-10 years, depending on your results and the type of screening test.  Diabetes screening. This is done by checking your blood sugar (glucose) after you have not eaten  for a while (fasting). You may have this done every 1-3 years.  Sexually transmitted disease (STD) testing. Follow these instructions at home: Eating and drinking  Eat a diet that includes fresh fruits and vegetables, whole grains, lean protein, and low-fat dairy products.  Take vitamin and mineral supplements as  recommended by your health care provider.  Do not drink alcohol if your health care provider tells you not to drink.  If you drink alcohol: ? Limit how much you have to 0-2 drinks a day. ? Be aware of how much alcohol is in your drink. In the [place] one drink equals one 12 oz bottle of beer (355 mL), one 5 oz glass of wine (148 mL), or one 1 oz glass of hard liquor (44 mL). Lifestyle  Take daily care of your teeth and gums.  Stay active. Exercise for at least 30 minutes on 5 or more days each week.  Do not use any products that contain nicotine or tobacco, such as cigarettes, e-cigarettes, and chewing tobacco. If you need help quitting, ask your health care provider.  If you are sexually active, practice safe sex. Use a condom or other form of protection to prevent STIs (sexually transmitted infections).  Talk with your health care provider about taking a low-dose aspirin every day starting at age 53. What's next?  Go to your health care provider once a year for a well check visit.  Ask your health care provider how often you should have your eyes and teeth checked.  Stay up to date on all vaccines. This information is not intended to replace advice given to you by your health care provider. Make sure you discuss any questions you have with your health care provider. Document Revised: 11/15/2018 Document Reviewed: 11/15/2018 Elsevier Patient Education  2020 [employer].

## 2020-06-30 LAB — COMPREHENSIVE METABOLIC PANEL
ALT: 35 IU/L (ref 0–44)
AST: 23 IU/L (ref 0–40)
Albumin/Globulin Ratio: 1.8 (ref 1.2–2.2)
Albumin: 4.3 g/dL (ref 3.8–4.9)
Alkaline Phosphatase: 88 IU/L (ref 48–121)
BUN/Creatinine Ratio: 14 (ref 9–20)
BUN: 16 mg/dL (ref 6–24)
Bilirubin Total: 1.3 mg/dL — ABNORMAL HIGH (ref 0.0–1.2)
CO2: 20 mmol/L (ref 20–29)
Calcium: 9.7 mg/dL (ref 8.7–10.2)
Chloride: 103 mmol/L (ref 96–106)
Creatinine, Ser: 1.17 mg/dL (ref 0.76–1.27)
GFR calc Af Amer: 82 mL/min/{1.73_m2} (ref >59)
GFR calc non Af Amer: 71 mL/min/{1.73_m2} (ref >59)
Globulin, Total: 2.4 g/dL (ref 1.5–4.5)
Glucose: 86 mg/dL (ref 65–99)
Potassium: 4 mmol/L (ref 3.5–5.2)
Sodium: 140 mmol/L (ref 134–144)
Total Protein: 6.7 g/dL (ref 6.0–8.5)

## 2020-06-30 LAB — LIPID PANEL WITH LDL/HDL RATIO
Cholesterol, Total: 132 mg/dL (ref 100–199)
HDL: 37 mg/dL — ABNORMAL LOW (ref >39)
LDL Chol Calc (NIH): 66 mg/dL (ref 0–99)
LDL/HDL Ratio: 1.8 ratio (ref 0.0–3.6)
Triglycerides: 168 mg/dL — ABNORMAL HIGH (ref 0–149)
VLDL Cholesterol Cal: 29 mg/dL (ref 5–40)

## 2020-06-30 LAB — CBC WITH DIFFERENTIAL/PLATELET
Basophils Absolute: 0 10*3/uL (ref 0.0–0.2)
Basos: 1 %
EOS (ABSOLUTE): 0.1 10*3/uL (ref 0.0–0.4)
Eos: 2 %
Hematocrit: 45.3 % (ref 37.5–51.0)
Hemoglobin: 16.3 g/dL (ref 13.0–17.7)
Immature Grans (Abs): 0 10*3/uL (ref 0.0–0.1)
Immature Granulocytes: 0 %
Lymphocytes Absolute: 2.5 10*3/uL (ref 0.7–3.1)
Lymphs: 41 %
MCH: 31.4 pg (ref 26.6–33.0)
MCHC: 36 g/dL — ABNORMAL HIGH (ref 31.5–35.7)
MCV: 87 fL (ref 79–97)
Monocytes Absolute: 0.5 10*3/uL (ref 0.1–0.9)
Monocytes: 8 %
Neutrophils Absolute: 3 10*3/uL (ref 1.4–7.0)
Neutrophils: 48 %
Platelets: 248 10*3/uL (ref 150–450)
RBC: 5.19 x10E6/uL (ref 4.14–5.80)
RDW: 12.4 % (ref 11.6–15.4)
WBC: 6.2 10*3/uL (ref 3.4–10.8)

## 2020-06-30 LAB — HEPATITIS C ANTIBODY: Hep C Virus Ab: <0.1 s/co ratio (ref 0.0–0.9)

## 2020-06-30 LAB — TSH: TSH: 2.36 u[IU]/mL (ref 0.450–4.500)

## 2020-06-30 LAB — PSA: Prostate Specific Ag, Serum: 1.2 ng/mL (ref 0.0–4.0)

## 2020-07-01 ENCOUNTER — Telehealth: Payer: Self-pay

## 2020-07-01 NOTE — Telephone Encounter (Signed)
Patient advised of lab results

## 2020-07-01 NOTE — Telephone Encounter (Signed)
-----   Message from Jerrol Banana., MD sent at 06/30/2020 12:46 PM EDT ----- Labs stable.

## 2020-07-16 ENCOUNTER — Other Ambulatory Visit: Payer: Self-pay

## 2020-07-16 ENCOUNTER — Ambulatory Visit: Payer: BC Managed Care – PPO | Admitting: Dermatology

## 2020-07-16 ENCOUNTER — Encounter: Payer: Self-pay | Admitting: Dermatology

## 2020-07-16 DIAGNOSIS — L905 Scar conditions and fibrosis of skin: Secondary | ICD-10-CM | POA: Diagnosis not present

## 2020-07-16 DIAGNOSIS — D229 Melanocytic nevi, unspecified: Secondary | ICD-10-CM

## 2020-07-16 DIAGNOSIS — D2371 Other benign neoplasm of skin of right lower limb, including hip: Secondary | ICD-10-CM | POA: Diagnosis not present

## 2020-07-16 DIAGNOSIS — D1800 Hemangioma unspecified site: Secondary | ICD-10-CM

## 2020-07-16 DIAGNOSIS — L821 Other seborrheic keratosis: Secondary | ICD-10-CM

## 2020-07-16 DIAGNOSIS — Z1283 Encounter for screening for malignant neoplasm of skin: Secondary | ICD-10-CM

## 2020-07-16 DIAGNOSIS — D239 Other benign neoplasm of skin, unspecified: Secondary | ICD-10-CM

## 2020-07-16 DIAGNOSIS — Z86018 Personal history of other benign neoplasm: Secondary | ICD-10-CM | POA: Diagnosis not present

## 2020-07-16 DIAGNOSIS — L820 Inflamed seborrheic keratosis: Secondary | ICD-10-CM

## 2020-07-16 DIAGNOSIS — L814 Other melanin hyperpigmentation: Secondary | ICD-10-CM

## 2020-07-16 DIAGNOSIS — Z808 Family history of malignant neoplasm of other organs or systems: Secondary | ICD-10-CM

## 2020-07-16 DIAGNOSIS — L578 Other skin changes due to chronic exposure to nonionizing radiation: Secondary | ICD-10-CM

## 2020-07-16 NOTE — Progress Notes (Signed)
Follow-Up Visit   Subjective  Nathaniel Gonzalez is a 54 y.o. male who presents for the following: Annual Exam (Hx of dysplastic nevi - patient does have lesions on his back that sometimes crack open that he would like checked, and a lesion on his foot that his wife worries about). The patient presents for Total-Body Skin Exam (TBSE) for skin cancer screening and mole check.  The following portions of the chart were reviewed this encounter and updated as appropriate:  Tobacco  Allergies  Meds  Problems  Med Hx  Surg Hx  Fam Hx     Review of Systems:  No other skin or systemic complaints except as noted in HPI or Assessment and Plan.  Objective  Well appearing patient in no apparent distress; mood and affect are within normal limits.  A full examination was performed including scalp, head, eyes, ears, nose, lips, neck, chest, axillae, abdomen, back, buttocks, bilateral upper extremities, bilateral lower extremities, hands, feet, fingers, toes, fingernails, and toenails. All findings within normal limits unless otherwise noted below.  Objective  numerous - see history: Scar with no evidence of recurrence.   Objective  back x 6, ant trunk x 12, face x 4 (22): Erythematous keratotic or waxy stuck-on papule or plaque.   Objective  Right Upper Back: Dyspigmented smooth macule or patch.   Objective  in mother: In mother   Objective  R dorsum foot: Firm pink/brown papulenodule with dimple sign.   Assessment & Plan  History of dysplastic nevus numerous - see history  Clear. Observe for recurrence. Call clinic for new or changing lesions.  Recommend regular skin exams, daily broad-spectrum spf 30+ sunscreen use, and photoprotection.     Inflamed seborrheic keratosis (22) back x 6, ant trunk x 12, face x 4  Destruction of lesion - back x 6, ant trunk x 12, face x 4 Complexity: simple   Destruction method: cryotherapy   Informed consent: discussed and consent  obtained   Timeout:  patient name, date of birth, surgical site, and procedure verified Lesion destroyed using liquid nitrogen: Yes   Region frozen until ice ball extended beyond lesion: Yes   Outcome: patient tolerated procedure well with no complications   Post-procedure details: wound care instructions given    Scar Right Upper Back  H/o itching - discussed with patient that itching is common but if persistent it can be treated with topical steroids or ILK injections  Family history of melanoma in mother  Recommend frequent skin exams and sun protection daily. RTC for any new or changing moles, lesions, or spots.   Dermatofibroma R dorsum foot  Benign, observe.    Skin cancer screening   Lentigines - Scattered tan macules - Discussed due to sun exposure - Benign, observe - Call for any changes  Seborrheic Keratoses - Stuck-on, waxy, tan-brown papules and plaques  - Discussed benign etiology and prognosis. - Observe - Call for any changes  Melanocytic Nevi - Tan-brown and/or pink-flesh-colored symmetric macules and papules - Benign appearing on exam today - Observation - Call clinic for new or changing moles - Recommend daily use of broad spectrum spf 30+ sunscreen to sun-exposed areas.   Hemangiomas - Red papules - Discussed benign nature - Observe - Call for any changes  Actinic Damage - diffuse scaly erythematous macules with underlying dyspigmentation - Recommend daily broad spectrum sunscreen SPF 30+ to sun-exposed areas, reapply every 2 hours as needed.  - Call for new or changing lesions.  Skin  cancer screening performed today.  Return in about 6 months (around 01/16/2021) for TBSE.  Luther Redo, CMA, am acting as scribe for Sarina Ser, MD .  Documentation: I have reviewed the above documentation for accuracy and completeness, and I agree with the above.  Sarina Ser, MD

## 2020-07-21 ENCOUNTER — Encounter: Payer: Self-pay | Admitting: Dermatology

## 2020-08-12 ENCOUNTER — Other Ambulatory Visit: Payer: Self-pay | Admitting: Family Medicine

## 2020-08-12 DIAGNOSIS — I639 Cerebral infarction, unspecified: Secondary | ICD-10-CM

## 2020-08-12 NOTE — Telephone Encounter (Signed)
Requested Prescriptions  Pending Prescriptions Disp Refills  . atorvastatin (LIPITOR) 40 MG tablet [Pharmacy Med Name: ATORVASTATIN 40 MG TABLET] 90 tablet 3    Sig: TAKE 1 TABLET (40 MG TOTAL) BY MOUTH DAILY AT 6 PM.     Cardiovascular:  Antilipid - Statins Failed - 08/12/2020  1:19 AM      Failed - LDL in normal range and within 360 days    LDL Chol Calc (NIH)  Date Value Ref Range Status  06/29/2020 66 0 - 99 mg/dL Final         Failed - HDL in normal range and within 360 days    HDL  Date Value Ref Range Status  06/29/2020 37 (L) >39 mg/dL Final         Failed - Triglycerides in normal range and within 360 days    Triglycerides  Date Value Ref Range Status  06/29/2020 168 (H) 0 - 149 mg/dL Final         Passed - Total Cholesterol in normal range and within 360 days    Cholesterol, Total  Date Value Ref Range Status  06/29/2020 132 100 - 199 mg/dL Final         Passed - Patient is not pregnant      Passed - Valid encounter within last 12 months    Recent Outpatient Visits          1 month ago Annual physical exam   [facility] Jerrol Banana., MD   8 months ago Cerebral infarction due to thrombosis of vertebral artery, unspecified blood vessel laterality [facility])   [facility] Jerrol Banana., [facility]   1 year ago Annual physical exam   [facility] Jerrol Banana., MD   1 year ago Benign essential HTN   [facility] Jerrol Banana., MD   2 years ago Annual physical exam   [facility] Jerrol Banana., MD      Future Appointments            In 3 months Jerrol Banana., MD [facility], [place]   In 11 months Ralene Bathe, MD [place]

## 2020-09-06 ENCOUNTER — Other Ambulatory Visit: Payer: Self-pay | Admitting: Family Medicine

## 2020-09-06 DIAGNOSIS — I10 Essential (primary) hypertension: Secondary | ICD-10-CM

## 2020-09-06 NOTE — Telephone Encounter (Signed)
Requested Prescriptions  Pending Prescriptions Disp Refills  . verapamil (CALAN-SR) 240 MG CR tablet [Pharmacy Med Name: VERAPAMIL ER 240 MG TABLET] 90 tablet 1    Sig: TAKE 1 TABLET BY MOUTH AT BEDTIME.     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

## 2020-09-09 ENCOUNTER — Other Ambulatory Visit: Payer: Self-pay | Admitting: Family Medicine

## 2020-09-09 DIAGNOSIS — I10 Essential (primary) hypertension: Secondary | ICD-10-CM

## 2020-11-24 NOTE — Progress Notes (Signed)
I,April Miller,acting as a scribe for Wilhemena Durie, MD.,have documented all relevant documentation on the behalf of Keiffer Piper, MD,as directed by  Wilhemena Durie, MD while in the presence of Wilhemena Durie, MD.   Established patient visit   Patient: Nathaniel Gonzalez   DOB: [birth year]   54 y.o. Male  MRN: [id number] Visit Date: 11/25/2020  Today's healthcare provider: Wilhemena Durie, MD   Chief Complaint  Patient presents with  . Follow-up  . Hypertension   Subjective    HPI  Patient feels well and he is having no complaints.  He is taking his medications as prescribed. Hypertension, follow-up  BP Readings from Last 3 Encounters:  11/25/20 126/79  06/17/20 128/79  04/09/20 127/89   Wt Readings from Last 3 Encounters:  11/25/20 224 lb (101.6 kg)  06/17/20 224 lb 12.8 oz (102 kg)  04/09/20 225 lb (102.1 kg)     He was last seen for hypertension 5 months ago.  BP at that visit was 128/79. Management since that visit includes; on metoprolol and verapamil.  He reports good compliance with treatment. He is not having side effects. none He is exercising. He is not adherent to low salt diet.   Outside blood pressures are not checking.  He does not smoke.  Use of agents associated with hypertension: none.   --------------------------------------------------------------------       Medications: Outpatient Medications Prior to Visit  Medication Sig  . atorvastatin (LIPITOR) 40 MG tablet TAKE 1 TABLET (40 MG TOTAL) BY MOUTH DAILY AT 6 PM.  . cetirizine (ZYRTEC) 10 MG tablet Take 10 mg by mouth daily.  . Cholecalciferol 25 MCG (1000 UT) tablet Take 1,000 Units by mouth daily.   . metoprolol tartrate (LOPRESSOR) 25 MG tablet Take 1 tablet (25 mg total) by mouth 2 (two) times daily.  . verapamil (CALAN-SR) 240 MG CR tablet TAKE 1 TABLET BY MOUTH AT BEDTIME.  Marland Kitchen apixaban (ELIQUIS) 5 MG TABS tablet Take 1 tablet (5 mg total) by mouth 2 (two)  times daily.   No facility-administered medications prior to visit.    Review of Systems  Constitutional: Negative for appetite change, chills and fever.  Respiratory: Negative for chest tightness, shortness of breath and wheezing.   Cardiovascular: Negative for chest pain and palpitations.  Gastrointestinal: Negative for abdominal pain, nausea and vomiting.       Objective    BP 126/79 (BP Location: Right Arm, Patient Position: Sitting, Cuff Size: Large)   Pulse (!) 55   Temp 98.5 F (36.9 C) (Oral)   Resp 16   Ht 5\' 10"  (1.778 m)   Wt 224 lb (101.6 kg)   SpO2 98%   BMI 32.14 kg/m  BP Readings from Last 3 Encounters:  11/25/20 126/79  06/17/20 128/79  04/09/20 127/89   Wt Readings from Last 3 Encounters:  11/25/20 224 lb (101.6 kg)  06/17/20 224 lb 12.8 oz (102 kg)  04/09/20 225 lb (102.1 kg)      Physical Exam Vitals reviewed.  Constitutional:      Appearance: Normal appearance. He is well-developed and normal weight.  HENT:     Head: Normocephalic and atraumatic.     Right Ear: External ear normal.     Left Ear: External ear normal.     Nose: Nose normal.  Eyes:     General: No scleral icterus.    Conjunctiva/sclera: Conjunctivae normal.  Neck:     Thyroid: No thyromegaly.  Cardiovascular:     Rate and Rhythm: Normal rate and regular rhythm.     Heart sounds: Normal heart sounds.  Pulmonary:     Effort: Pulmonary effort is normal.  Abdominal:     Palpations: Abdomen is soft.  Lymphadenopathy:     Cervical: No cervical adenopathy.  Skin:    General: Skin is warm and dry.  Neurological:     General: No focal deficit present.     Mental Status: He is alert and oriented to person, place, and time. Mental status is at baseline.     Cranial Nerves: No cranial nerve deficit.     Motor: No abnormal muscle tone.     Coordination: Coordination normal.     Comments: Grossly nonfocal.  Psychiatric:        Mood and Affect: Mood normal.        Behavior:  Behavior normal.        Thought Content: Thought content normal.        Judgment: Judgment normal.       No results found for any visits on 11/25/20.  Assessment & Plan     1. Benign essential HTN Controlled.  2. 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  3. Paroxysmal atrial fibrillation (HCC) Lifelong anticoagulant.   Return in about 6 months (around 05/26/2021).         Sharod Cranford Mon, MD  [facility] [phone] (phone) [phone] (fax)  [place]

## 2020-11-25 ENCOUNTER — Encounter: Payer: Self-pay | Admitting: Family Medicine

## 2020-11-25 ENCOUNTER — Other Ambulatory Visit: Payer: Self-pay

## 2020-11-25 ENCOUNTER — Ambulatory Visit: Payer: BC Managed Care – PPO | Admitting: Family Medicine

## 2020-11-25 VITALS — BP 126/79 | HR 55 | Temp 98.5°F | Resp 16 | Ht 70.0 in | Wt 224.0 lb

## 2020-11-25 DIAGNOSIS — I63119 Cerebral infarction due to embolism of unspecified vertebral artery: Secondary | ICD-10-CM

## 2020-11-25 DIAGNOSIS — I480 Paroxysmal atrial fibrillation: Secondary | ICD-10-CM

## 2020-11-25 DIAGNOSIS — I10 Essential (primary) hypertension: Secondary | ICD-10-CM

## 2021-03-19 ENCOUNTER — Other Ambulatory Visit: Payer: Self-pay | Admitting: Family Medicine

## 2021-03-19 DIAGNOSIS — I10 Essential (primary) hypertension: Secondary | ICD-10-CM

## 2021-06-21 ENCOUNTER — Encounter: Payer: Self-pay | Admitting: Family Medicine

## 2021-06-21 NOTE — Progress Notes (Deleted)
Complete physical exam   Patient: Nathaniel Gonzalez   DOB: May 19, 1966   55 y.o. Male  MRN: [id number] Visit Date: 06/21/2021  Today's healthcare provider: Wilhemena Durie, MD   No chief complaint on file.  Subjective    Nathaniel Gonzalez is a 55 y.o. male who presents today for a complete physical exam.  He reports consuming a {diet types:17450} diet. {Exercise:19826} He generally feels {well/fairly well/poorly:18703}. He reports sleeping {well/fairly well/poorly:18703}. He {does/does not:200015} have additional problems to discuss today.  HPI  ***  Past Medical History:  Diagnosis Date   Allergy    Atypical mole 11/19/2019   right upper back 1.0cm lat to spine/excision   Atypical mole 10/08/2019   right upper back 3.0cm lat to spine/mod, left upper back 1.0cm lat to spine/mild, left upper back 5.0cm lat to spine/mild   Atypical mole 09/24/2019   right mid scapula/excision   Atypical mole 07/01/2019   right mid bakc about 13cm lat to spine   Atypical mole 07/21/2015   right lower back paraspinal   Atypical mole 05/06/2014   right lower back paraspinal/mild   Atypical mole 11/25/2013   right superior medial scapula/mild   Chicken pox    Medical history non-contributory    Stroke [facility])    Past Surgical History:  Procedure Laterality Date   COLONOSCOPY  2011   COLONOSCOPY WITH PROPOFOL N/A 07/19/2018   Procedure: COLONOSCOPY WITH PROPOFOL;  Surgeon: [name], MD;  Location: [facility] ENDOSCOPY;  Service: Gastroenterology;  Laterality: N/A;   ESOPHAGOGASTRODUODENOSCOPY (EGD) WITH PROPOFOL N/A 07/19/2018   Procedure: ESOPHAGOGASTRODUODENOSCOPY (EGD) WITH PROPOFOL;  Surgeon: [name], MD;  Location: [facility] ENDOSCOPY;  Service: Gastroenterology;  Laterality: N/A;   MOUTH SURGERY     SKIN LESION EXCISION     several, he goes yearly   TEE WITHOUT CARDIOVERSION N/A 08/17/2016   Procedure: TRANSESOPHAGEAL ECHOCARDIOGRAM (TEE);  Surgeon: Corey Skains, MD;   Location: [facility] ORS;  Service: Cardiovascular;  Laterality: N/A;   Social History   Socioeconomic History   Marital status: Married    Spouse name: Not on file   Number of children: Not on file   Years of education: Not on file   Highest education level: Not on file  Occupational History   Not on file  Tobacco Use   Smoking status: Never   Smokeless tobacco: Never  Vaping Use   Vaping Use: Never used  Substance and Sexual Activity   Alcohol use: No   Drug use: No   Sexual activity: Not on file  Other Topics Concern   Not on file  Social History Narrative   Not on file   Social Determinants of Health   Financial Resource Strain: Not on file  Food Insecurity: Not on file  Transportation Needs: Not on file  Physical Activity: Not on file  Stress: Not on file  Social Connections: Not on file  Intimate Partner Violence: Not on file   Family Status  Relation Name Status   Mother  Deceased   Father  Alive   Brother  Alive   Brother  Alive   Daughter  Alive   Son  [place]  Deceased   MGM  Deceased   MGF  Deceased   PGM  Deceased   PGF  Deceased   Cousin  Alive   Family History  Problem Relation Age of Onset   Skin cancer Mother    Colon polyps Mother  Cancer Mother        skin, ureter cancer   Heart attack Father    COPD Father    Aneurysm Father    Colon polyps Father    Heart disease Father    Hypertension Brother    Cancer Maternal Aunt        skin   Cancer Maternal Grandmother        unknown type   Heart disease Maternal Grandmother    Cancer Paternal Grandmother        liver   Heart disease Paternal Grandfather        died age 20 MI   Cancer Cousin        skin   Allergies  Allergen Reactions   Erythromycin Hives    Childhood allergy     Patient Care Team: Jerrol Banana., MD as PCP - General (Family Medicine) Vladimir Crofts, MD as Consulting Physician (Neurology) Corey Skains, MD as Consulting Physician (Cardiology)    Medications: Outpatient Medications Prior to Visit  Medication Sig   apixaban (ELIQUIS) 5 MG TABS tablet Take 1 tablet (5 mg total) by mouth 2 (two) times daily.   atorvastatin (LIPITOR) 40 MG tablet TAKE 1 TABLET (40 MG TOTAL) BY MOUTH DAILY AT 6 PM.   cetirizine (ZYRTEC) 10 MG tablet Take 10 mg by mouth daily.   Cholecalciferol 25 MCG (1000 UT) tablet Take 1,000 Units by mouth daily.    metoprolol tartrate (LOPRESSOR) 25 MG tablet Take 1 tablet (25 mg total) by mouth 2 (two) times daily.   verapamil (CALAN-SR) 240 MG CR tablet TAKE 1 TABLET BY MOUTH AT BEDTIME.   No facility-administered medications prior to visit.    Review of Systems  All other systems reviewed and are negative.  {Labs  Heme  Chem  Endocrine  Serology  Results Review (optional):23779}  Objective    There were no vitals taken for this visit. {Show previous vital signs (optional):23777}  Physical Exam  ***  Last depression screening scores PHQ 2/9 Scores 11/25/2020 06/17/2020 06/17/2019  PHQ - 2 Score 0 0 0  PHQ- 9 Score 0 0 0   Last fall risk screening Fall Risk  06/17/2020  Falls in the past year? 0  Number falls in past yr: 0  Injury with Fall? 0  Risk for fall due to : No Fall Risks  Follow up Falls evaluation completed   Last Audit-C alcohol use screening Alcohol Use Disorder Test (AUDIT) 11/25/2020  1. How often do you have a drink containing alcohol? 0  2. How many drinks containing alcohol do you have on a typical day when you are drinking? 0  3. How often do you have six or more drinks on one occasion? 0  AUDIT-C Score 0  Alcohol Brief Interventions/Follow-up AUDIT Score <7 follow-up not indicated   A score of 3 or more in women, and 4 or more in men indicates increased risk for alcohol abuse, EXCEPT if all of the points are from question 1   No results found for any visits on 06/21/21.  Assessment & Plan    Routine Health Maintenance and Physical Exam  Exercise Activities and  Dietary recommendations  Goals   None     Immunization History  Administered Date(s) Administered   Influenza,inj,Quad PF,6+ Mos 08/13/2019   Td 06/17/2019   Tdap 11/12/2008    Health Maintenance  Topic Date Due   COVID-19 Vaccine (1) Never done   Pneumococcal Vaccine 0-64 Years  old (1 - PCV) Never done   Zoster Vaccines- Shingrix (1 of 2) Never done   INFLUENZA VACCINE  07/05/2021   COLONOSCOPY (Pts 45-39yrs Insurance coverage will need to be confirmed)  07/20/2023   TETANUS/TDAP  06/16/2029   Hepatitis C Screening  Completed   HIV Screening  Completed   HPV VACCINES  Aged Out    Discussed health benefits of physical activity, and encouraged him to engage in regular exercise appropriate for his age and condition.  ***  No follow-ups on file.     {provider attestation***:1}   Wilhemena Durie, MD  [facility] [phone] (phone) [phone] (fax)  [place]

## 2021-06-22 ENCOUNTER — Other Ambulatory Visit: Payer: Self-pay | Admitting: Family Medicine

## 2021-06-22 DIAGNOSIS — I10 Essential (primary) hypertension: Secondary | ICD-10-CM

## 2021-06-22 NOTE — Telephone Encounter (Signed)
Requested Prescriptions  Pending Prescriptions Disp Refills  . 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, unspecified blood vessel laterality [facility])   [facility] Jerrol Banana., MD   2 years ago Annual physical exam   [facility] MD   2 years ago Benign essential HTN   [facility] Jerrol Banana., MD      Future Appointments            In 2 weeks Jerrol Banana., MD [facility], [place]   In 2 weeks Ralene Bathe, MD [place]   In 4 months Jerrol Banana., MD [facility]           Passed - Last BP in normal range    BP Readings from Last 1 Encounters:  11/25/20 126/79

## 2021-07-06 ENCOUNTER — Ambulatory Visit: Payer: Self-pay | Admitting: Family Medicine

## 2021-07-07 ENCOUNTER — Other Ambulatory Visit: Payer: Self-pay

## 2021-07-07 ENCOUNTER — Ambulatory Visit: Payer: BC Managed Care – PPO | Admitting: Dermatology

## 2021-07-07 ENCOUNTER — Encounter: Payer: Self-pay | Admitting: Dermatology

## 2021-07-07 DIAGNOSIS — D1800 Hemangioma unspecified site: Secondary | ICD-10-CM

## 2021-07-07 DIAGNOSIS — L814 Other melanin hyperpigmentation: Secondary | ICD-10-CM

## 2021-07-07 DIAGNOSIS — L578 Other skin changes due to chronic exposure to nonionizing radiation: Secondary | ICD-10-CM

## 2021-07-07 DIAGNOSIS — Z86018 Personal history of other benign neoplasm: Secondary | ICD-10-CM | POA: Diagnosis not present

## 2021-07-07 DIAGNOSIS — Z1283 Encounter for screening for malignant neoplasm of skin: Secondary | ICD-10-CM

## 2021-07-07 DIAGNOSIS — L918 Other hypertrophic disorders of the skin: Secondary | ICD-10-CM

## 2021-07-07 DIAGNOSIS — L821 Other seborrheic keratosis: Secondary | ICD-10-CM

## 2021-07-07 NOTE — Patient Instructions (Signed)

## 2021-07-07 NOTE — Progress Notes (Signed)
   Follow-Up Visit   Subjective  Nathaniel Gonzalez is a 55 y.o. male who presents for the following: Annual Exam (Hx dysplastic nevi - patient has noticed new skin tags on his L axilla.). The patient presents for Total-Body Skin Exam (TBSE) for skin cancer screening and mole check.  The following portions of the chart were reviewed this encounter and updated as appropriate:   Tobacco  Allergies  Meds  Problems  Med Hx  Surg Hx  Fam Hx     Review of Systems:  No other skin or systemic complaints except as noted in HPI or Assessment and Plan.  Objective  Well appearing patient in no apparent distress; mood and affect are within normal limits.  A full examination was performed including scalp, head, eyes, ears, nose, lips, neck, chest, axillae, abdomen, back, buttocks, bilateral upper extremities, bilateral lower extremities, hands, feet, fingers, toes, fingernails, and toenails. All findings within normal limits unless otherwise noted below.  numerous see history Scar with no evidence of recurrence.   Assessment & Plan  History of dysplastic nevus numerous see history  Clear. Observe for recurrence. Call clinic for new or changing lesions.  Recommend regular skin exams, daily broad-spectrum spf 30+ sunscreen use, and photoprotection.     Skin cancer screening  Lentigines - Scattered tan macules - Due to sun exposure - Benign-appering, observe - Recommend daily broad spectrum sunscreen SPF 30+ to sun-exposed areas, reapply every 2 hours as needed. - Call for any changes  Seborrheic Keratoses - Stuck-on, waxy, tan-brown papules and/or plaques  - Benign-appearing - Discussed benign etiology and prognosis. - Observe - Call for any changes  Melanocytic Nevi - Tan-brown and/or pink-flesh-colored symmetric macules and papules - Benign appearing on exam today - Observation - Call clinic for new or changing moles - Recommend daily use of broad spectrum spf 30+  sunscreen to sun-exposed areas.   Hemangiomas - Red papules - Discussed benign nature - Observe - Call for any changes  Actinic Damage - Chronic condition, secondary to cumulative UV/sun exposure - diffuse scaly erythematous macules with underlying dyspigmentation - Recommend daily broad spectrum sunscreen SPF 30+ to sun-exposed areas, reapply every 2 hours as needed.  - Staying in the shade or wearing long sleeves, sun glasses (UVA+UVB protection) and wide brim hats (4-inch brim around the entire circumference of the hat) are also recommended for sun protection.  - Call for new or changing lesions.  Acrochordons (Skin Tags) - Fleshy, skin-colored pedunculated papules - Benign appearing.  - Observe. - If desired, they can be removed with an in office procedure that is not covered by insurance. - Please call the clinic if you notice any new or changing lesions.  Skin cancer screening performed today.  Return in about 1 year (around 07/07/2022) for TBSE.  Luther Redo, CMA, am acting as scribe for Sarina Ser, MD . Documentation: I have reviewed the above documentation for accuracy and completeness, and I agree with the above.  Sarina Ser, MD

## 2021-07-14 ENCOUNTER — Ambulatory Visit: Payer: BC Managed Care – PPO | Admitting: Family Medicine

## 2021-07-14 ENCOUNTER — Other Ambulatory Visit: Payer: Self-pay

## 2021-07-14 ENCOUNTER — Ambulatory Visit
Admission: RE | Admit: 2021-07-14 | Discharge: 2021-07-14 | Disposition: A | Discharge status: Home / Self Care | Payer: BC Managed Care – PPO | Attending: Family Medicine | Admitting: Family Medicine

## 2021-07-14 ENCOUNTER — Ambulatory Visit
Admission: RE | Admit: 2021-07-14 | Discharge: 2021-07-14 | Disposition: A | Discharge status: Home / Self Care | Payer: BC Managed Care – PPO | Source: Ambulatory Visit | Attending: Family Medicine | Admitting: Family Medicine

## 2021-07-14 ENCOUNTER — Encounter: Payer: Self-pay | Admitting: Family Medicine

## 2021-07-14 VITALS — BP 145/82 | HR 77 | Temp 98.6°F | Resp 16 | Ht 70.0 in | Wt 227.0 lb

## 2021-07-14 DIAGNOSIS — E782 Mixed hyperlipidemia: Secondary | ICD-10-CM

## 2021-07-14 DIAGNOSIS — Z8371 Family history of colonic polyps: Secondary | ICD-10-CM

## 2021-07-14 DIAGNOSIS — Z0000 Encounter for general adult medical examination without abnormal findings: Secondary | ICD-10-CM | POA: Diagnosis not present

## 2021-07-14 DIAGNOSIS — I480 Paroxysmal atrial fibrillation: Secondary | ICD-10-CM

## 2021-07-14 DIAGNOSIS — M25572 Pain in left ankle and joints of left foot: Secondary | ICD-10-CM

## 2021-07-14 DIAGNOSIS — I639 Cerebral infarction, unspecified: Secondary | ICD-10-CM | POA: Diagnosis not present

## 2021-07-14 DIAGNOSIS — Z8673 Personal history of transient ischemic attack (TIA), and cerebral infarction without residual deficits: Secondary | ICD-10-CM

## 2021-07-14 DIAGNOSIS — I10 Essential (primary) hypertension: Secondary | ICD-10-CM

## 2021-07-14 MED ORDER — ATORVASTATIN CALCIUM 40 MG PO TABS: 40.0000 mg | ORAL_TABLET | Freq: Every day | ORAL | 3 refills | Status: DC

## 2021-07-14 MED ORDER — VERAPAMIL HCL ER 240 MG PO TBCR
EXTENDED_RELEASE_TABLET | ORAL | 3 refills | Status: DC
Start: 2021-07-14 — End: 2022-09-26

## 2021-07-14 MED ORDER — PREDNISONE 20 MG PO TABS
20.0000 mg | ORAL_TABLET | Freq: Every day | ORAL | 0 refills | Status: AC
Start: 2021-07-14 — End: 2021-07-19

## 2021-07-14 MED ORDER — APIXABAN 5 MG PO TABS: 5.0000 mg | ORAL_TABLET | Freq: Two times a day (BID) | ORAL | 0 refills | Status: AC

## 2021-07-14 MED ORDER — METOPROLOL TARTRATE 25 MG PO TABS: 25.0000 mg | ORAL_TABLET | Freq: Three times a day (TID) | ORAL | 5 refills | Status: AC

## 2021-07-14 NOTE — Progress Notes (Signed)
[place]     Established patient visit   Patient: Nathaniel Gonzalez   DOB: 12/25/1965   55 y.o. Male  MRN: [id number] Visit Date: 07/14/2021  Today's healthcare provider: Wilhemena Durie, MD   Chief Complaint  Patient presents with   Hypertension   Annual Exam   Ankle Pain   Subjective    HPI   Annual exam Nathaniel Gonzalez is a 55 y.o. male who presents today for a complete physical exam. He reports consuming a general diet. He generally feels well. He does have additional problems to discuss today.  Patient is married and has twins at home.  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  Ankle Pain Patient reports that he has had left ankle pain for several weeks. He reports that the symptoms are worse in the morning. He reports the pain a 5 on a scale of 1-10. He denies any injuries. He has not used anything OTC for symptoms.  He denies any known trauma.  So of note he had an episode of A. fib last night and took an extra metoprolol for the tachycardia.  It worked.  Hypertension, follow-up  BP Readings from Last 3 Encounters:  07/14/21 (!) 145/82  11/25/20 126/79  06/17/20 128/79   Wt Readings from Last 3 Encounters:  07/14/21 227 lb (103 kg)  11/25/20 224 lb (101.6 kg)  06/17/20 224 lb 12.8 oz (102 kg)     He was last seen for hypertension 6 months ago.  BP at that visit was 126/79. Management since that visit includes no medication changes.  He reports good compliance with treatment. He is not having side effects.  He is following a Regular diet. He is exercising. He does not smoke.  Use of agents associated with hypertension: none.   Outside blood pressures are checked occasionally. Symptoms: No chest pain No chest pressure  No palpitations No syncope  No dyspnea No orthopnea  No paroxysmal nocturnal dyspnea No lower extremity edema   Pertinent labs: Lab Results  Component Value Date   CHOL 132 06/29/2020   HDL 37  (L) 06/29/2020   LDLCALC 66 06/29/2020   TRIG 168 (H) 06/29/2020   CHOLHDL 3.5 06/17/2019   Lab Results  Component Value Date   NA 140 06/29/2020   K 4.0 06/29/2020   CREATININE 1.17 06/29/2020   GFRNONAA 71 06/29/2020   GFRAA 82 06/29/2020   GLUCOSE 86 06/29/2020     The 10-year ASCVD risk score Mikey Bussing DC Jr., et al., 2013) is: 5.4%       Medications: Outpatient Medications Prior to Visit  Medication Sig   apixaban (ELIQUIS) 5 MG TABS tablet Take 1 tablet (5 mg total) by mouth 2 (two) times daily.   atorvastatin (LIPITOR) 40 MG tablet TAKE 1 TABLET (40 MG TOTAL) BY MOUTH DAILY AT 6 PM.   cetirizine (ZYRTEC) 10 MG tablet Take 10 mg by mouth daily.   Cholecalciferol 25 MCG (1000 UT) tablet Take 1,000 Units by mouth daily.    metoprolol tartrate (LOPRESSOR) 25 MG tablet Take 1 tablet (25 mg total) by mouth 2 (two) times daily.   verapamil (CALAN-SR) 240 MG CR tablet TAKE 1 TABLET BY MOUTH EVERYDAY AT BEDTIME   No facility-administered medications prior to visit.    Review of Systems  Constitutional:  Negative for activity change and fatigue.  Respiratory:  Negative for cough and shortness of breath.   Cardiovascular:  Negative for chest pain, palpitations  and leg swelling.  Neurological:  Negative for dizziness, light-headedness and headaches.      Objective    BP (!) 145/82   Pulse 77   Temp 98.6 F (37 C)   Resp 16   Ht '5\' 10"'$  (1.778 m)   Wt 227 lb (103 kg)   BMI 32.57 kg/m     Physical Exam Vitals reviewed.  Constitutional:      General: He is not in acute distress.    Appearance: Normal appearance. He is normal weight. He is not ill-appearing, toxic-appearing or diaphoretic.     Comments: Pleasant individual, cooperative with exam.  HENT:     Head: Normocephalic and atraumatic.     Right Ear: External ear normal.     Left Ear: External ear normal.     Nose: Nose normal.     Mouth/Throat:     Mouth: Mucous membranes are moist.  Eyes:     General: No  scleral icterus.    Conjunctiva/sclera: Conjunctivae normal.  Cardiovascular:     Rate and Rhythm: Normal rate and regular rhythm.     Heart sounds: Normal heart sounds.  Pulmonary:     Effort: Pulmonary effort is normal. No respiratory distress.     Breath sounds: Normal breath sounds.  Abdominal:     Palpations: Abdomen is soft.     Tenderness: There is no abdominal tenderness. There is no guarding.  Genitourinary:    Penis: Normal.      Testes: Normal.  Musculoskeletal:     Cervical back: Neck supple.     Comments: Tenderness over the left medial malleolus with mild  swelling just below this.  Skin:    General: Skin is warm and dry.     Coloration: Skin is not jaundiced.     Findings: No erythema.  Neurological:     Mental Status: He is alert and oriented to person, place, and time. Mental status is at baseline.     Comments: Some residual sensory defects in right fingers  Psychiatric:        Mood and Affect: Mood normal.        Behavior: Behavior normal.        Thought Content: Thought content normal.        Judgment: Judgment normal.      No results found for any visits on 07/14/21.  Assessment & Plan     1. Annual physical exam Baseline labs.  2. Cerebral infarction ([place]) Struve CVA secondary to A. fib.  Lifelong Eliquis - atorvastatin (LIPITOR) 40 MG tablet; Take 1 tablet (40 mg total) by mouth daily.  Dispense: 90 tablet; Refill: 3 - apixaban (ELIQUIS) 5 MG TABS tablet; Take 1 tablet (5 mg total) by mouth 2 (two) times daily.  Dispense: 60 tablet; Refill: 0  3. Benign essential HTN Controlled. - metoprolol tartrate (LOPRESSOR) 25 MG tablet; Take 1 tablet (25 mg total) by mouth 3 (three) times daily.  Dispense: 90 tablet; Refill: 5 - verapamil (CALAN-SR) 240 MG CR tablet; TAKE 1 TABLET BY MOUTH EVERYDAY AT BEDTIME  Dispense: 90 tablet; Refill: 3  4. Acute left ankle pain Time obtain x-ray and will treat with prednisone for 5 days.  Refer to orthopedics if it  does not resolve.  He might have a little ankle sprain here - predniSONE (DELTASONE) 20 MG tablet; Take 1 tablet (20 mg total) by mouth daily with breakfast for 5 days.  Dispense: 5 tablet; Refill: 0 - Ambulatory referral to Orthopedic Surgery -  DG Ankle Complete Left; Future  5. Paroxysmal atrial fibrillation (HCC) Will increase metoprolol to 25 mg 3 times daily can have a third dose during the day if he has tachycardia.  6. Hyperlipidemia, mixed On statin  7. Family history of colonic polyps Follow-up colonoscopy 2019   No follow-ups on file.      I, Wilhemena Durie, MD, have reviewed all documentation for this visit. The documentation on 07/23/21 for the exam, diagnosis, procedures, and orders are all accurate and complete.    Ricahrd Cranford Mon, MD  [facility] [phone] (phone) [phone] (fax)  [place]

## 2021-07-15 ENCOUNTER — Ambulatory Visit: Payer: BC Managed Care – PPO | Admitting: Dermatology

## 2021-07-20 ENCOUNTER — Telehealth: Payer: Self-pay

## 2021-07-20 NOTE — Telephone Encounter (Signed)
Advised patient of results. Patient reports that the prednisone has helped with his ankle pain, but he wants to know what the treatment will be going forward? Please advise. Thanks!

## 2021-07-20 NOTE — Telephone Encounter (Signed)
-----   Message from Jerrol Banana., MD sent at 07/20/2021  8:20 AM EDT ----- Arthritic changes but no fracture. Please advise patient.

## 2021-07-21 NOTE — Telephone Encounter (Signed)
Advised patient as below. Referral was placed during OV. Referral coordinator is waiting on you to sign the office note. Thanks!

## 2021-10-27 ENCOUNTER — Encounter: Payer: Self-pay | Admitting: Family Medicine

## 2021-10-27 ENCOUNTER — Other Ambulatory Visit: Payer: Self-pay

## 2021-10-27 ENCOUNTER — Ambulatory Visit (INDEPENDENT_AMBULATORY_CARE_PROVIDER_SITE_OTHER): Payer: BC Managed Care – PPO | Admitting: Family Medicine

## 2021-10-27 VITALS — BP 123/88 | HR 86 | Temp 97.7°F | Wt 213.3 lb

## 2021-10-27 DIAGNOSIS — E782 Mixed hyperlipidemia: Secondary | ICD-10-CM

## 2021-10-27 DIAGNOSIS — I10 Essential (primary) hypertension: Secondary | ICD-10-CM

## 2021-10-27 DIAGNOSIS — Z0000 Encounter for general adult medical examination without abnormal findings: Secondary | ICD-10-CM

## 2021-10-27 DIAGNOSIS — I480 Paroxysmal atrial fibrillation: Secondary | ICD-10-CM

## 2021-10-27 DIAGNOSIS — Z1389 Encounter for screening for other disorder: Secondary | ICD-10-CM | POA: Diagnosis not present

## 2021-10-27 DIAGNOSIS — Z125 Encounter for screening for malignant neoplasm of prostate: Secondary | ICD-10-CM

## 2021-10-27 DIAGNOSIS — I63119 Cerebral infarction due to embolism of unspecified vertebral artery: Secondary | ICD-10-CM | POA: Diagnosis not present

## 2021-10-27 LAB — POCT URINALYSIS DIPSTICK
Bilirubin, UA: NEGATIVE
Blood, UA: NEGATIVE
Glucose, UA: NEGATIVE
Ketones, UA: NEGATIVE
Leukocytes, UA: NEGATIVE
Nitrite, UA: NEGATIVE
Protein, UA: NEGATIVE
Spec Grav, UA: 1.01 (ref 1.010–1.025)
Urobilinogen, UA: 0.2 E.U./dL
pH, UA: 7.5 (ref 5.0–8.0)

## 2021-10-27 NOTE — Progress Notes (Signed)
Complete physical exam   Patient: Nathaniel Gonzalez   DOB: 1966/08/08   55 y.o. Male  MRN: [id number] Visit Date: 10/27/2021  Today's healthcare provider: Wilhemena Durie, MD   Chief Complaint  Patient presents with   Annual Exam   Subjective    DJIBRIL Gonzalez is a 55 y.o. male who presents today for a complete physical exam.  He reports consuming a general diet. The patient does not participate in regular exercise at present. He generally feels well. He reports sleeping well. He does not have additional problems to discuss today.  He feels well.  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    Past Medical History:  Diagnosis Date   Allergy    Atypical mole 11/19/2019   right upper back 1.0cm lat to spine/excision   Atypical mole 10/08/2019   right upper back 3.0cm lat to spine/mod, left upper back 1.0cm lat to spine/mild, left upper back 5.0cm lat to spine/mild   Atypical mole 09/24/2019   right mid scapula/excision   Atypical mole 07/01/2019   right mid bakc about 13cm lat to spine   Atypical mole 07/21/2015   right lower back paraspinal   Atypical mole 05/06/2014   right lower back paraspinal/mild   Atypical mole 11/25/2013   right superior medial scapula/mild   Chicken pox    Medical history non-contributory    Stroke [place])    Past Surgical History:  Procedure Laterality Date   COLONOSCOPY  2011   COLONOSCOPY WITH PROPOFOL N/A 07/19/2018   Procedure: COLONOSCOPY WITH PROPOFOL;  Surgeon: [name], MD;  Location: [facility] ENDOSCOPY;  Service: Gastroenterology;  Laterality: N/A;   ESOPHAGOGASTRODUODENOSCOPY (EGD) WITH PROPOFOL N/A 07/19/2018   Procedure: ESOPHAGOGASTRODUODENOSCOPY (EGD) WITH PROPOFOL;  Surgeon: [name], MD;  Location: [facility] ENDOSCOPY;  Service: Gastroenterology;  Laterality: N/A;   MOUTH SURGERY     SKIN LESION EXCISION     several, he goes yearly   TEE WITHOUT CARDIOVERSION N/A 08/17/2016   Procedure: TRANSESOPHAGEAL  ECHOCARDIOGRAM (TEE);  Surgeon: Corey Skains, MD;  Location: [facility] ORS;  Service: Cardiovascular;  Laterality: N/A;   Social History   Socioeconomic History   Marital status: Married    Spouse name: Not on file   Number of children: Not on file   Years of education: Not on file   Highest education level: Not on file  Occupational History   Not on file  Tobacco Use   Smoking status: Never   Smokeless tobacco: Never  Vaping Use   Vaping Use: Never used  Substance and Sexual Activity   Alcohol use: No   Drug use: No   Sexual activity: Not on file  Other Topics Concern   Not on file  Social History Narrative   Not on file   Social Determinants of Health   Financial Resource Strain: Not on file  Food Insecurity: Not on file  Transportation Needs: Not on file  Physical Activity: Not on file  Stress: Not on file  Social Connections: Not on file  Intimate Partner Violence: Not on file   Family Status  Relation Name Status   Mother  Deceased   Father  Alive   Brother  Alive   Brother  Alive   Daughter  Alive   Son  [place]  Deceased   MGM  Deceased   MGF  Deceased   [place]  Deceased   PGF  Deceased   Cousin  Alive  Family History  Problem Relation Age of Onset   Skin cancer Mother    Colon polyps Mother    Cancer Mother        skin, ureter cancer   Heart attack Father    COPD Father    Aneurysm Father    Colon polyps Father    Heart disease Father    Hypertension Brother    Cancer Maternal Aunt        skin   Cancer Maternal Grandmother        unknown type   Heart disease Maternal Grandmother    Cancer Paternal Grandmother        liver   Heart disease Paternal Grandfather        died age 26 MI   Cancer Cousin        skin   Allergies  Allergen Reactions   Erythromycin Hives    Childhood allergy     Patient Care Team: Jerrol Banana., MD as PCP - General (Family Medicine) Vladimir Crofts, MD as Consulting Physician  (Neurology) Corey Skains, MD as Consulting Physician (Cardiology)   Medications: Outpatient Medications Prior to Visit  Medication Sig   atorvastatin (LIPITOR) 40 MG tablet Take 1 tablet (40 mg total) by mouth daily.   cetirizine (ZYRTEC) 10 MG tablet Take 10 mg by mouth daily.   Cholecalciferol 25 MCG (1000 UT) tablet Take 1,000 Units by mouth daily.    metoprolol tartrate (LOPRESSOR) 25 MG tablet Take 1 tablet (25 mg total) by mouth 3 (three) times daily.   verapamil (CALAN-SR) 240 MG CR tablet TAKE 1 TABLET BY MOUTH EVERYDAY AT BEDTIME   apixaban (ELIQUIS) 5 MG TABS tablet Take 1 tablet (5 mg total) by mouth 2 (two) times daily.   No facility-administered medications prior to visit.    Review of Systems  All other systems reviewed and are negative.     Objective    BP 123/88 (BP Location: Right Arm, Patient Position: Sitting, Cuff Size: Normal)   Pulse 86   Temp 97.7 F (36.5 C) (Oral)   Wt 213 lb 4.8 oz (96.8 kg)   SpO2 98%   BMI 30.61 kg/m  BP Readings from Last 3 Encounters:  10/27/21 123/88  07/14/21 (!) 145/82  11/25/20 126/79   Wt Readings from Last 3 Encounters:  10/27/21 213 lb 4.8 oz (96.8 kg)  07/14/21 227 lb (103 kg)  11/25/20 224 lb (101.6 kg)      Physical Exam Vitals reviewed.  Constitutional:      General: He is not in acute distress.    Appearance: Normal appearance. He is normal weight. He is not ill-appearing, toxic-appearing or diaphoretic.     Comments: Pleasant individual, cooperative with exam.  HENT:     Head: Normocephalic and atraumatic.     Right Ear: External ear normal.     Left Ear: External ear normal.     Nose: Nose normal.     Mouth/Throat:     Mouth: Mucous membranes are moist.  Eyes:     General: No scleral icterus.    Conjunctiva/sclera: Conjunctivae normal.  Cardiovascular:     Rate and Rhythm: Normal rate and regular rhythm.     Heart sounds: Normal heart sounds.  Pulmonary:     Effort: Pulmonary effort is  normal. No respiratory distress.     Breath sounds: Normal breath sounds.  Abdominal:     Palpations: Abdomen is soft.     Tenderness: There is  no abdominal tenderness. There is no guarding.  Musculoskeletal:        General: Normal range of motion.     Cervical back: Neck supple.  Skin:    General: Skin is warm and dry.     Coloration: Skin is not jaundiced.     Findings: No erythema.  Neurological:     Mental Status: He is alert and oriented to person, place, and time. Mental status is at baseline.     Comments: Some residual sensory defects in right fingers  Psychiatric:        Mood and Affect: Mood normal.        Behavior: Behavior normal.        Thought Content: Thought content normal.        Judgment: Judgment normal.      Last depression screening scores PHQ 2/9 Scores 10/27/2021 11/25/2020 06/17/2020  PHQ - 2 Score 0 0 0  PHQ- 9 Score 0 0 0   Last fall risk screening Fall Risk  10/27/2021  Falls in the past year? 0  Number falls in past yr: 0  Injury with Fall? 0  Risk for fall due to : No Fall Risks  Follow up -   Last Audit-C alcohol use screening Alcohol Use Disorder Test (AUDIT) 10/27/2021  1. How often do you have a drink containing alcohol? 0  2. How many drinks containing alcohol do you have on a typical day when you are drinking? -  3. How often do you have six or more drinks on one occasion? -  AUDIT-C Score -  Alcohol Brief Interventions/Follow-up -   A score of 3 or more in women, and 4 or more in men indicates increased risk for alcohol abuse, EXCEPT if all of the points are from question 1   Results for orders placed or performed in visit on 10/27/21  POCT urinalysis dipstick  Result Value Ref Range   Color, UA Yellow    Clarity, UA Clear    Glucose, UA Negative Negative   Bilirubin, UA Negative    Ketones, UA Negative    Spec Grav, UA 1.010 1.010 - 1.025   Blood, UA Negative    pH, UA 7.5 5.0 - 8.0   Protein, UA Negative Negative    Urobilinogen, UA 0.2 0.2 or 1.0 E.U./dL   Nitrite, UA Negative    Leukocytes, UA Negative Negative    Assessment & Plan    Routine Health Maintenance and Physical Exam  Exercise Activities and Dietary recommendations  Goals   None     Immunization History  Administered Date(s) Administered   Influenza,inj,Quad PF,6+ Mos 08/13/2019   Td 06/17/2019   Tdap 11/12/2008    Health Maintenance  Topic Date Due   COVID-19 Vaccine (1) Never done   Pneumococcal Vaccine 24-31 Years old (1 - PCV) Never done   Zoster Vaccines- Shingrix (1 of 2) Never done   INFLUENZA VACCINE  07/05/2021   COLONOSCOPY (Pts 45-23yrs Insurance coverage will need to be confirmed)  07/20/2023   TETANUS/TDAP  06/16/2029   Hepatitis C Screening  Completed   HIV Screening  Completed   HPV VACCINES  Aged Out    Discussed health benefits of physical activity, and encouraged him to engage in regular exercise appropriate for his age and condition.  1. Annual physical exam  - Lipid panel - TSH - CBC w/Diff/Platelet - Comprehensive Metabolic Panel (CMET)  2. Benign essential HTN 6 months. - Lipid panel - TSH -  CBC w/Diff/Platelet - Comprehensive Metabolic Panel (CMET)  3. Hyperlipidemia, mixed  - Lipid panel - TSH - CBC w/Diff/Platelet - Comprehensive Metabolic Panel (CMET)  4. 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 fib most likely - Lipid panel - TSH - CBC w/Diff/Platelet - Comprehensive Metabolic Panel (CMET)  5. Paroxysmal atrial fibrillation (HCC)  - Lipid panel - TSH - CBC w/Diff/Platelet - Comprehensive Metabolic Panel (CMET)  6. Prostate cancer screening  - PSA  7. Screening for blood or protein in urine  - POCT urinalysis dipstick--Normal   Return in about 7 months (around 05/24/2022).     I, Wilhemena Durie, MD, have reviewed all documentation for this visit. The documentation on 10/28/21 for the  exam, diagnosis, procedures, and orders are all accurate and complete.    Adrin Cranford Mon, MD  [facility] [phone] (phone) [phone] (fax)  [place]

## 2021-10-28 LAB — CBC WITH DIFFERENTIAL/PLATELET
Basophils Absolute: 0 10*3/uL (ref 0.0–0.2)
Basos: 1 %
EOS (ABSOLUTE): 0.1 10*3/uL (ref 0.0–0.4)
Eos: 2 %
Hematocrit: 45.8 % (ref 37.5–51.0)
Hemoglobin: 15.8 g/dL (ref 13.0–17.7)
Immature Grans (Abs): 0 10*3/uL (ref 0.0–0.1)
Immature Granulocytes: 0 %
Lymphocytes Absolute: 2.5 10*3/uL (ref 0.7–3.1)
Lymphs: 37 %
MCH: 30.5 pg (ref 26.6–33.0)
MCHC: 34.5 g/dL (ref 31.5–35.7)
MCV: 88 fL (ref 79–97)
Monocytes Absolute: 0.5 10*3/uL (ref 0.1–0.9)
Monocytes: 7 %
Neutrophils Absolute: 3.5 10*3/uL (ref 1.4–7.0)
Neutrophils: 53 %
Platelets: 280 10*3/uL (ref 150–450)
RBC: 5.18 x10E6/uL (ref 4.14–5.80)
RDW: 11.7 % (ref 11.6–15.4)
WBC: 6.6 10*3/uL (ref 3.4–10.8)

## 2021-10-28 LAB — COMPREHENSIVE METABOLIC PANEL
ALT: 23 IU/L (ref 0–44)
AST: 16 IU/L (ref 0–40)
Albumin/Globulin Ratio: 1.8 (ref 1.2–2.2)
Albumin: 4.6 g/dL (ref 3.8–4.9)
Alkaline Phosphatase: 83 IU/L (ref 44–121)
BUN/Creatinine Ratio: 11 (ref 9–20)
BUN: 12 mg/dL (ref 6–24)
Bilirubin Total: 1.1 mg/dL (ref 0.0–1.2)
CO2: 26 mmol/L (ref 20–29)
Calcium: 9.7 mg/dL (ref 8.7–10.2)
Chloride: 102 mmol/L (ref 96–106)
Creatinine, Ser: 1.06 mg/dL (ref 0.76–1.27)
Globulin, Total: 2.6 g/dL (ref 1.5–4.5)
Glucose: 86 mg/dL (ref 70–99)
Potassium: 4.7 mmol/L (ref 3.5–5.2)
Sodium: 142 mmol/L (ref 134–144)
Total Protein: 7.2 g/dL (ref 6.0–8.5)
eGFR: 83 mL/min/{1.73_m2} (ref >59)

## 2021-10-28 LAB — LIPID PANEL
Chol/HDL Ratio: 3.7 ratio (ref 0.0–5.0)
Cholesterol, Total: 136 mg/dL (ref 100–199)
HDL: 37 mg/dL — ABNORMAL LOW (ref >39)
LDL Chol Calc (NIH): 77 mg/dL (ref 0–99)
Triglycerides: 121 mg/dL (ref 0–149)
VLDL Cholesterol Cal: 22 mg/dL (ref 5–40)

## 2021-10-28 LAB — TSH: TSH: 1.24 u[IU]/mL (ref 0.450–4.500)

## 2021-10-28 LAB — PSA: Prostate Specific Ag, Serum: 1.1 ng/mL (ref 0.0–4.0)

## 2021-11-02 ENCOUNTER — Encounter: Payer: Self-pay | Admitting: Family Medicine

## 2022-05-23 NOTE — Progress Notes (Signed)
Established patient visit  I,April Miller,acting as a scribe for Megan Mans, MD.,have documented all relevant documentation on the behalf of Jashon Overend, MD,as directed by  Megan Mans, MD while in the presence of Megan Mans, MD.   Patient: Nathaniel Gonzalez   DOB: June 22, 1966   56 y.o. Male  MRN: [id number] Visit Date: 05/24/2022  Today's healthcare provider: Megan Mans, MD   Chief Complaint  Patient presents with   Follow-up   Hypertension   Subjective    HPI  Patient feels well and is taking his medications.  He is working on trying to lose weight.  Hypertension, follow-up  BP Readings from Last 3 Encounters:  05/24/22 120/73  10/27/21 123/88  07/14/21 (!) 145/82   Wt Readings from Last 3 Encounters:  05/24/22 214 lb (97.1 kg)  10/27/21 213 lb 4.8 oz (96.8 kg)  07/14/21 227 lb (103 kg)     He was last seen for hypertension 6 months ago.  BP at that visit was as above. Management since that visit includes none.  Outside blood pressures are not checking.  Pertinent labs Lab Results  Component Value Date   CHOL 136 10/27/2021   HDL 37 (L) 10/27/2021   LDLCALC 77 10/27/2021   TRIG 121 10/27/2021   CHOLHDL 3.7 10/27/2021   Lab Results  Component Value Date   NA 142 10/27/2021   K 4.7 10/27/2021   CREATININE 1.06 10/27/2021   EGFR 83 10/27/2021   GLUCOSE 86 10/27/2021   TSH 1.240 10/27/2021     The 10-year ASCVD risk score (Arnett DK, et al., 2019) is: 4.4%  ---------------------------------------------------------------------------------------------------   Medications: Outpatient Medications Prior to Visit  Medication Sig   apixaban (ELIQUIS) 5 MG TABS tablet Take 1 tablet (5 mg total) by mouth 2 (two) times daily.   atorvastatin (LIPITOR) 40 MG tablet Take 1 tablet (40 mg total) by mouth daily.   cetirizine (ZYRTEC) 10 MG tablet Take 10 mg by mouth daily.   Cholecalciferol 25 MCG (1000 UT) tablet Take  1,000 Units by mouth daily.    metoprolol tartrate (LOPRESSOR) 25 MG tablet Take 1 tablet (25 mg total) by mouth 3 (three) times daily.   verapamil (CALAN-SR) 240 MG CR tablet TAKE 1 TABLET BY MOUTH EVERYDAY AT BEDTIME   No facility-administered medications prior to visit.    Review of Systems  Last lipids Lab Results  Component Value Date   CHOL 136 10/27/2021   HDL 37 (L) 10/27/2021   LDLCALC 77 10/27/2021   TRIG 121 10/27/2021   CHOLHDL 3.7 10/27/2021       Objective    BP 120/73 (BP Location: Right Arm, Patient Position: Sitting, Cuff Size: Large)   Pulse (!) 59   Resp 16   Wt 214 lb (97.1 kg)   SpO2 97%   BMI 30.71 kg/m  BP Readings from Last 3 Encounters:  05/24/22 120/73  10/27/21 123/88  07/14/21 (!) 145/82   Wt Readings from Last 3 Encounters:  05/24/22 214 lb (97.1 kg)  10/27/21 213 lb 4.8 oz (96.8 kg)  07/14/21 227 lb (103 kg)      Physical Exam Vitals reviewed.  Constitutional:      Appearance: Normal appearance. He is well-developed and normal weight.  HENT:     Head: Normocephalic and atraumatic.     Right Ear: External ear normal.     Left Ear: External ear normal.     Nose: Nose normal.  Eyes:     General: No scleral icterus.    Conjunctiva/sclera: Conjunctivae normal.  Neck:     Thyroid: No thyromegaly.  Cardiovascular:     Rate and Rhythm: Normal rate and regular rhythm.     Heart sounds: Normal heart sounds.  Pulmonary:     Effort: Pulmonary effort is normal.  Abdominal:     Palpations: Abdomen is soft.  Lymphadenopathy:     Cervical: No cervical adenopathy.  Skin:    General: Skin is warm and dry.  Neurological:     General: No focal deficit present.     Mental Status: He is alert and oriented to person, place, and time. Mental status is at baseline.     Cranial Nerves: No cranial nerve deficit.     Motor: No abnormal muscle tone.     Coordination: Coordination normal.     Comments: Grossly nonfocal.  Psychiatric:         Mood and Affect: Mood normal.        Behavior: Behavior normal.        Thought Content: Thought content normal.        Judgment: Judgment normal.       No results found for any visits on 05/24/22.  Assessment & Plan     1. Benign essential HTN Good control.  2. History of cerebellar stroke After stroke was found later on that patient had A-fib  3. Paroxysmal atrial fibrillation (HCC) By cardiology  4. Hyperlipidemia, mixed On statin  No follow-ups on file.      I, Megan Mans, MD, have reviewed all documentation for this visit. The documentation on 05/28/22 for the exam, diagnosis, procedures, and orders are all accurate and complete.    Vayla Wilhelmi Wendelyn Breslow, MD  [facility] [phone] (phone) [phone] (fax)  [facility]

## 2022-05-23 NOTE — Progress Notes (Deleted)
      Established patient visit   Patient: Nathaniel Gonzalez   DOB: Dec 07, 1965   56 y.o. Male  MRN: [id number] Visit Date: 05/24/2022  Today's healthcare provider: Wilhemena Durie, MD   No chief complaint on file.  Subjective    HPI  ***  Medications: Outpatient Medications Prior to Visit  Medication Sig   apixaban (ELIQUIS) 5 MG TABS tablet Take 1 tablet (5 mg total) by mouth 2 (two) times daily.   atorvastatin (LIPITOR) 40 MG tablet Take 1 tablet (40 mg total) by mouth daily.   cetirizine (ZYRTEC) 10 MG tablet Take 10 mg by mouth daily.   Cholecalciferol 25 MCG (1000 UT) tablet Take 1,000 Units by mouth daily.    metoprolol tartrate (LOPRESSOR) 25 MG tablet Take 1 tablet (25 mg total) by mouth 3 (three) times daily.   verapamil (CALAN-SR) 240 MG CR tablet TAKE 1 TABLET BY MOUTH EVERYDAY AT BEDTIME   No facility-administered medications prior to visit.    Review of Systems  {Labs  Heme  Chem  Endocrine  Serology  Results Review (optional):23779}   Objective    There were no vitals taken for this visit. {Show previous vital signs (optional):23777}  Physical Exam  ***  No results found for any visits on 05/24/22.  Assessment & Plan     ***  No follow-ups on file.      {provider attestation***:1}   Wilhemena Durie, MD  [facility] [phone] (phone) [phone] (fax)  [place]

## 2022-05-24 ENCOUNTER — Encounter: Payer: Self-pay | Admitting: Family Medicine

## 2022-05-24 ENCOUNTER — Ambulatory Visit: Payer: BC Managed Care – PPO | Admitting: Family Medicine

## 2022-05-24 VITALS — BP 120/73 | HR 59 | Resp 16 | Wt 214.0 lb

## 2022-05-24 DIAGNOSIS — E782 Mixed hyperlipidemia: Secondary | ICD-10-CM

## 2022-05-24 DIAGNOSIS — I480 Paroxysmal atrial fibrillation: Secondary | ICD-10-CM | POA: Diagnosis not present

## 2022-05-24 DIAGNOSIS — Z8673 Personal history of transient ischemic attack (TIA), and cerebral infarction without residual deficits: Secondary | ICD-10-CM | POA: Diagnosis not present

## 2022-05-24 DIAGNOSIS — I10 Essential (primary) hypertension: Secondary | ICD-10-CM | POA: Diagnosis not present

## 2022-07-07 ENCOUNTER — Ambulatory Visit (INDEPENDENT_AMBULATORY_CARE_PROVIDER_SITE_OTHER): Payer: BC Managed Care – PPO | Admitting: Dermatology

## 2022-07-07 DIAGNOSIS — L821 Other seborrheic keratosis: Secondary | ICD-10-CM

## 2022-07-07 DIAGNOSIS — L280 Lichen simplex chronicus: Secondary | ICD-10-CM

## 2022-07-07 DIAGNOSIS — D239 Other benign neoplasm of skin, unspecified: Secondary | ICD-10-CM

## 2022-07-07 DIAGNOSIS — L918 Other hypertrophic disorders of the skin: Secondary | ICD-10-CM

## 2022-07-07 DIAGNOSIS — Z86018 Personal history of other benign neoplasm: Secondary | ICD-10-CM | POA: Diagnosis not present

## 2022-07-07 DIAGNOSIS — L578 Other skin changes due to chronic exposure to nonionizing radiation: Secondary | ICD-10-CM | POA: Diagnosis not present

## 2022-07-07 DIAGNOSIS — D229 Melanocytic nevi, unspecified: Secondary | ICD-10-CM

## 2022-07-07 DIAGNOSIS — B079 Viral wart, unspecified: Secondary | ICD-10-CM | POA: Diagnosis not present

## 2022-07-07 DIAGNOSIS — Z1283 Encounter for screening for malignant neoplasm of skin: Secondary | ICD-10-CM | POA: Diagnosis not present

## 2022-07-07 DIAGNOSIS — L814 Other melanin hyperpigmentation: Secondary | ICD-10-CM

## 2022-07-07 DIAGNOSIS — D1800 Hemangioma unspecified site: Secondary | ICD-10-CM

## 2022-07-07 DIAGNOSIS — Z808 Family history of malignant neoplasm of other organs or systems: Secondary | ICD-10-CM

## 2022-07-07 NOTE — Patient Instructions (Addendum)
Cryotherapy Aftercare  Wash gently with soap and water everyday.   Apply Vaseline and Band-Aid daily until healed.   Melanoma ABCDEs  Melanoma is the most dangerous type of skin cancer, and is the leading cause of death from skin disease.  You are more likely to develop melanoma if you: Have light-colored skin, light-colored eyes, or red or blond hair Spend a lot of time in the sun Tan regularly, either outdoors or in a tanning bed Have had blistering sunburns, especially during childhood Have a close family member who has had a melanoma Have atypical moles or large birthmarks  Early detection of melanoma is key since treatment is typically straightforward and cure rates are extremely high if we catch it early.   The first sign of melanoma is often a change in a mole or a new dark spot.  The ABCDE system is a way of remembering the signs of melanoma.  A for asymmetry:  The two halves do not match. B for border:  The edges of the growth are irregular. C for color:  A mixture of colors are present instead of an even brown color. D for diameter:  Melanomas are usually (but not always) greater than 6mm - the size of a pencil eraser. E for evolution:  The spot keeps changing in size, shape, and color.  Please check your skin once per month between visits. You can use a small mirror in front and a large mirror behind you to keep an eye on the back side or your body.   If you see any new or changing lesions before your next follow-up, please call to schedule a visit.  Please continue daily skin protection including broad spectrum sunscreen SPF 30+ to sun-exposed areas, reapplying every 2 hours as needed when you're outdoors.   Staying in the shade or wearing long sleeves, sun glasses (UVA+UVB protection) and wide brim hats (4-inch brim around the entire circumference of the hat) are also recommended for sun protection.      Due to recent changes in healthcare laws, you may see results of  your pathology and/or laboratory studies on MyChart before the doctors have had a chance to review them. We understand that in some cases there may be results that are confusing or concerning to you. Please understand that not all results are received at the same time and often the doctors may need to interpret multiple results in order to provide you with the best plan of care or course of treatment. Therefore, we ask that you please give us 2 business days to thoroughly review all your results before contacting the office for clarification. Should we see a critical lab result, you will be contacted sooner.   If You Need Anything After Your Visit  If you have any questions or concerns for your doctor, please call our main line at [phone] and press option 4 to reach your doctor's medical assistant. If no one answers, please leave a voicemail as directed and we will return your call as soon as possible. Messages left after 4 pm will be answered the following business day.   You may also send us a message via MyChart. We typically respond to MyChart messages within 1-2 business days.  For prescription refills, please ask your pharmacy to contact our office. Our fax number is [phone].  If you have an urgent issue when the clinic is closed that cannot wait until the next business day, you can page your doctor at the number   below.    Please note that while we do our best to be available for urgent issues outside of office hours, we are not available 24/7.   If you have an urgent issue and are unable to reach us, you may choose to seek medical care at your doctor's office, retail clinic, urgent care center, or emergency room.  If you have a medical emergency, please immediately call 911 or go to the emergency department.  Pager Numbers  - Dr. Kowalski: [phone]  - Dr. Moye: [phone]  - Dr. Stewart: [phone]  In the event of inclement weather, please call our main line at  [phone] for an update on the status of any delays or closures.  Dermatology Medication Tips: Please keep the boxes that topical medications come in in order to help keep track of the instructions about where and how to use these. Pharmacies typically print the medication instructions only on the boxes and not directly on the medication tubes.   If your medication is too expensive, please contact our office at [phone] option 4 or send us a message through MyChart.   We are unable to tell what your co-pay for medications will be in advance as this is different depending on your insurance coverage. However, we may be able to find a substitute medication at lower cost or fill out paperwork to get insurance to cover a needed medication.   If a prior authorization is required to get your medication covered by your insurance company, please allow us 1-2 business days to complete this process.  Drug prices often vary depending on where the prescription is filled and some pharmacies may offer cheaper prices.  The website www.goodrx.com contains coupons for medications through different pharmacies. The prices here do not account for what the cost may be with help from insurance (it may be cheaper with your insurance), but the website can give you the price if you did not use any insurance.  - You can print the associated coupon and take it with your prescription to the pharmacy.  - You may also stop by our office during regular business hours and pick up a GoodRx coupon card.  - If you need your prescription sent electronically to a different pharmacy, notify our office through [facility] MyChart or by phone at [phone] option 4.     Si Usted Necesita Algo Despus de Su Visita  Tambin puede enviarnos un mensaje a travs de MyChart. Por lo general respondemos a los mensajes de MyChart en el transcurso de 1 a 2 das hbiles.  Para renovar recetas, por favor pida a su farmacia que se  ponga en contacto con nuestra oficina. Nuestro nmero de fax es el [phone].  Si tiene un asunto urgente cuando la clnica est cerrada y que no puede esperar hasta el siguiente da hbil, puede llamar/localizar a su doctor(a) al nmero que aparece a continuacin.   Por favor, tenga en cuenta que aunque hacemos todo lo posible para estar disponibles para asuntos urgentes fuera del horario de oficina, no estamos disponibles las 24 horas del da, los 7 das de la semana.   Si tiene un problema urgente y no puede comunicarse con nosotros, puede optar por buscar atencin mdica  en el consultorio de su doctor(a), en una clnica privada, en un centro de atencin urgente o en una sala de emergencias.  Si tiene una emergencia mdica, por favor llame inmediatamente al 911 o vaya a la sala de emergencias.  Nmeros de bper  -   Dr. Kowalski: [phone]  - Dra. Moye: [phone]  - Dra. Stewart: [phone]  En caso de inclemencias del tiempo, por favor llame a nuestra lnea principal al [phone] para una actualizacin sobre el estado de cualquier retraso o cierre.  Consejos para la medicacin en dermatologa: Por favor, guarde las cajas en las que vienen los medicamentos de uso tpico para ayudarle a seguir las instrucciones sobre dnde y cmo usarlos. Las farmacias generalmente imprimen las instrucciones del medicamento slo en las cajas y no directamente en los tubos del medicamento.   Si su medicamento es muy caro, por favor, pngase en contacto con nuestra oficina llamando al [phone] y presione la opcin 4 o envenos un mensaje a travs de MyChart.   No podemos decirle cul ser su copago por los medicamentos por adelantado ya que esto es diferente dependiendo de la cobertura de su seguro. Sin embargo, es posible que podamos encontrar un medicamento sustituto a menor costo o llenar un formulario para que el seguro cubra el medicamento que se considera necesario.   Si se requiere  una autorizacin previa para que su compaa de seguros cubra su medicamento, por favor permtanos de 1 a 2 das hbiles para completar este proceso.  Los precios de los medicamentos varan con frecuencia dependiendo del lugar de dnde se surte la receta y alguna farmacias pueden ofrecer precios ms baratos.  El sitio web www.goodrx.com tiene cupones para medicamentos de diferentes farmacias. Los precios aqu no tienen en cuenta lo que podra costar con la ayuda del seguro (puede ser ms barato con su seguro), pero el sitio web puede darle el precio si no utiliz ningn seguro.  - Puede imprimir el cupn correspondiente y llevarlo con su receta a la farmacia.  - Tambin puede pasar por nuestra oficina durante el horario de atencin regular y recoger una tarjeta de cupones de GoodRx.  - Si necesita que su receta se enve electrnicamente a una farmacia diferente, informe a nuestra oficina a travs de MyChart de [place] [facility] o por telfono llamando al [phone] y presione la opcin 4.  

## 2022-07-07 NOTE — Progress Notes (Signed)
Follow-Up Visit   Subjective  Nathaniel Gonzalez is a 56 y.o. male who presents for the following: Annual Exam (1 year tbse, hx of dysplastic nevus hx of isk. ). The patient presents for Total-Body Skin Exam (TBSE) for skin cancer screening and mole check.  The patient has spots, moles and lesions to be evaluated, some may be new or changing and the patient has concerns that these could be cancer.  The following portions of the chart were reviewed this encounter and updated as appropriate:  Tobacco  Allergies  Meds  Problems  Med Hx  Surg Hx  Fam Hx     Review of Systems: No other skin or systemic complaints except as noted in HPI or Assessment and Plan.  Objective  Well appearing patient in no apparent distress; mood and affect are within normal limits.  A full examination was performed including scalp, head, eyes, ears, nose, lips, neck, chest, axillae, abdomen, back, buttocks, bilateral upper extremities, bilateral lower extremities, hands, feet, fingers, toes, fingernails, and toenails. All findings within normal limits unless otherwise noted below.  left palm x 1 Verrucous papules -- Discussed viral etiology and contagion.    Assessment & Plan  Viral warts, unspecified type left palm x 1  Discussed viral etiology and risk of spread.  Discussed multiple treatments may be required to clear warts.  Discussed possible post-treatment dyspigmentation and risk of recurrence.  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  Today patient declines treatment at this time.  Recheck next visit  Skin cancer screening  Lentigines - Scattered tan macules - Due to sun exposure - Benign-appearing, observe - Recommend daily broad spectrum sunscreen SPF 30+ to sun-exposed areas, reapply every 2 hours as needed. - Call for any changes  Seborrheic Keratoses - Stuck-on, waxy, tan-brown papules and/or plaques  - Benign-appearing - Discussed benign etiology and prognosis. - Observe - Call for any changes  Melanocytic Nevi - Tan-brown and/or pink-flesh-colored symmetric macules and papules - Benign appearing on exam today - Observation - Call clinic for new or changing moles - Recommend daily use of broad spectrum spf 30+ sunscreen to sun-exposed areas.   Hemangiomas - Red papules - Discussed benign nature - Observe - Call for any changes  Acrochordons (Skin Tags) - Fleshy, skin-colored pedunculated papules - Benign appearing.  - Observe. - If desired, they can be removed with an in office procedure that is not covered by insurance. - Please call the clinic if you notice any new or changing lesions.  Actinic Damage - Chronic condition, secondary to cumulative UV/sun exposure - diffuse scaly erythematous macules with underlying dyspigmentation - Recommend daily broad spectrum sunscreen SPF 30+ to sun-exposed areas, reapply every 2 hours as needed.  - Staying in the shade or wearing long sleeves, sun glasses (UVA+UVB protection) and wide brim hats (4-inch brim around the entire circumference of the hat) are also recommended for sun protection.  - Call for new or changing lesions.  History of Dysplastic Nevi - No evidence of recurrence  today - Recommend regular full body skin exams - Recommend daily broad spectrum sunscreen SPF 30+ to sun-exposed areas, reapply every 2 hours as needed.  - Call if any new or changing lesions are noted between office visits  Family history of  melanoma In mother  Clear. Observe for recurrence. Call clinic for new or changing lesions.  Recommend regular skin exams, daily broad-spectrum spf 30+ sunscreen use, and photoprotection.   Skin cancer screening performed today. Return in about 1 year (around 07/08/2023) for TBSE. IRuthell Rummage, CMA, am acting as scribe for Sarina Ser, MD. Documentation: I have reviewed the above documentation for accuracy and completeness, and I agree with the above.  Sarina Ser, MD

## 2022-07-09 ENCOUNTER — Encounter: Payer: Self-pay | Admitting: Dermatology

## 2022-08-07 ENCOUNTER — Other Ambulatory Visit: Payer: Self-pay | Admitting: Family Medicine

## 2022-08-07 DIAGNOSIS — I639 Cerebral infarction, unspecified: Secondary | ICD-10-CM

## 2022-09-26 ENCOUNTER — Other Ambulatory Visit: Payer: Self-pay | Admitting: Family Medicine

## 2022-09-26 DIAGNOSIS — I10 Essential (primary) hypertension: Secondary | ICD-10-CM

## 2022-11-14 ENCOUNTER — Encounter: Payer: BC Managed Care – PPO | Admitting: Family Medicine

## 2023-01-02 ENCOUNTER — Other Ambulatory Visit: Payer: Self-pay | Admitting: Physician Assistant

## 2023-01-02 DIAGNOSIS — I10 Essential (primary) hypertension: Secondary | ICD-10-CM

## 2023-01-30 ENCOUNTER — Other Ambulatory Visit: Payer: Self-pay | Admitting: Physician Assistant

## 2023-01-30 DIAGNOSIS — I10 Essential (primary) hypertension: Secondary | ICD-10-CM

## 2023-02-13 ENCOUNTER — Other Ambulatory Visit: Payer: Self-pay | Admitting: Physician Assistant

## 2023-02-13 DIAGNOSIS — I10 Essential (primary) hypertension: Secondary | ICD-10-CM

## 2023-07-27 ENCOUNTER — Ambulatory Visit: Payer: BC Managed Care – PPO | Admitting: Dermatology

## 2023-07-27 VITALS — BP 151/80 | HR 54

## 2023-07-27 DIAGNOSIS — L814 Other melanin hyperpigmentation: Secondary | ICD-10-CM

## 2023-07-27 DIAGNOSIS — L821 Other seborrheic keratosis: Secondary | ICD-10-CM

## 2023-07-27 DIAGNOSIS — Z79899 Other long term (current) drug therapy: Secondary | ICD-10-CM

## 2023-07-27 DIAGNOSIS — L578 Other skin changes due to chronic exposure to nonionizing radiation: Secondary | ICD-10-CM

## 2023-07-27 DIAGNOSIS — Z1283 Encounter for screening for malignant neoplasm of skin: Secondary | ICD-10-CM

## 2023-07-27 DIAGNOSIS — Z86018 Personal history of other benign neoplasm: Secondary | ICD-10-CM

## 2023-07-27 DIAGNOSIS — W908XXA Exposure to other nonionizing radiation, initial encounter: Secondary | ICD-10-CM | POA: Diagnosis not present

## 2023-07-27 DIAGNOSIS — D229 Melanocytic nevi, unspecified: Secondary | ICD-10-CM

## 2023-07-27 DIAGNOSIS — L820 Inflamed seborrheic keratosis: Secondary | ICD-10-CM

## 2023-07-27 DIAGNOSIS — L570 Actinic keratosis: Secondary | ICD-10-CM | POA: Diagnosis not present

## 2023-07-27 DIAGNOSIS — D1801 Hemangioma of skin and subcutaneous tissue: Secondary | ICD-10-CM

## 2023-07-27 DIAGNOSIS — B353 Tinea pedis: Secondary | ICD-10-CM

## 2023-07-27 DIAGNOSIS — Z7189 Other specified counseling: Secondary | ICD-10-CM

## 2023-07-27 MED ORDER — KETOCONAZOLE 2 % EX CREA: TOPICAL_CREAM | CUTANEOUS | 11 refills | Status: AC

## 2023-07-27 NOTE — Progress Notes (Signed)
Follow-Up Visit   Subjective  Nathaniel Gonzalez is a 57 y.o. male who presents for the following: Skin Cancer Screening and Full Body Skin Exam  The patient presents for Total-Body Skin Exam (TBSE) for skin cancer screening and mole check. The patient has spots, moles and lesions to be evaluated, some may be new or changing and the patient may have concern these could be cancer.    The following portions of the chart were reviewed this encounter and updated as appropriate: medications, allergies, medical history  Review of Systems:  No other skin or systemic complaints except as noted in HPI or Assessment and Plan.  Objective  Well appearing patient in no apparent distress; mood and affect are within normal limits.  A full examination was performed including scalp, head, eyes, ears, nose, lips, neck, chest, axillae, abdomen, back, buttocks, bilateral upper extremities, bilateral lower extremities, hands, feet, fingers, toes, fingernails, and toenails. All findings within normal limits unless otherwise noted below.   Relevant physical exam findings are noted in the Assessment and Plan.  R sideburn x 3, L sideburn x 2 (5) Erythematous thin papules/macules with gritty scale.   L thigh x 1 Erythematous stuck-on, waxy papule or plaque    Assessment & Plan   SKIN CANCER SCREENING PERFORMED TODAY.  ACTINIC DAMAGE - Chronic condition, secondary to cumulative UV/sun exposure - diffuse scaly erythematous macules with underlying dyspigmentation - Recommend daily broad spectrum sunscreen SPF 30+ to sun-exposed areas, reapply every 2 hours as needed.  - Staying in the shade or wearing long sleeves, sun glasses (UVA+UVB protection) and wide brim hats (4-inch brim around the entire circumference of the hat) are also recommended for sun protection.  - Call for new or changing lesions.  LENTIGINES, SEBORRHEIC KERATOSES, HEMANGIOMAS - Benign normal skin lesions - Benign-appearing -  Call for any changes  MELANOCYTIC NEVI - Tan-brown and/or pink-flesh-colored symmetric macules and papules - Benign appearing on exam today - Observation - Call clinic for new or changing moles - Recommend daily use of broad spectrum spf 30+ sunscreen to sun-exposed areas.   HISTORY OF DYSPLASTIC NEVUS No evidence of recurrence today Recommend regular full body skin exams Recommend daily broad spectrum sunscreen SPF 30+ to sun-exposed areas, reapply every 2 hours as needed.  Call if any new or changing lesions are noted between office visits  AK (actinic keratosis) (5) R sideburn x 3, L sideburn x 2  Actinic keratoses are precancerous spots that appear secondary to cumulative UV radiation exposure/sun exposure over time. They are chronic with expected duration over 1 year. A portion of actinic keratoses will progress to squamous cell carcinoma of the skin. It is not possible to reliably predict which spots will progress to skin cancer and so treatment is recommended to prevent development of skin cancer.  Recommend daily broad spectrum sunscreen SPF 30+ to sun-exposed areas, reapply every 2 hours as needed.  Recommend staying in the shade or wearing long sleeves, sun glasses (UVA+UVB protection) and wide brim hats (4-inch brim around the entire circumference of the hat). Call for new or changing lesions.   Destruction of lesion - R sideburn x 3, L sideburn x 2 (5) Complexity: simple   Destruction method: cryotherapy   Informed consent: discussed and consent obtained   Timeout:  patient name, date of birth, surgical site, and procedure verified Lesion destroyed using liquid nitrogen: Yes   Region frozen until ice ball extended beyond lesion: Yes   Outcome: patient tolerated procedure well  with no complications   Post-procedure details: wound care instructions given    Inflamed seborrheic keratosis L thigh x 1  Symptomatic, irritating, patient would like treated.   Destruction  of lesion - L thigh x 1 Complexity: simple   Destruction method: cryotherapy   Informed consent: discussed and consent obtained   Timeout:  patient name, date of birth, surgical site, and procedure verified Lesion destroyed using liquid nitrogen: Yes   Region frozen until ice ball extended beyond lesion: Yes   Outcome: patient tolerated procedure well with no complications   Post-procedure details: wound care instructions given    TINEA PEDIS Exam: Scaling and maceration web spaces and over distal and lateral soles. Chronic and persistent condition with duration or expected duration over one year. Condition is symptomatic / bothersome to patient. Not to goal.  Treatment Plan: Start Ketoconazole 2% cream QHS. Patient defers oral Lamisil today.   Return in about 1 year (around 07/26/2024) for TBSE.  Maylene Roes, CMA, am acting as scribe for Armida Sans, MD .   Documentation: I have reviewed the above documentation for accuracy and completeness, and I agree with the above.  Armida Sans, MD

## 2023-07-27 NOTE — Patient Instructions (Signed)

## 2023-08-04 ENCOUNTER — Encounter: Payer: Self-pay | Admitting: Dermatology

## 2023-08-10 ENCOUNTER — Encounter: Payer: BC Managed Care – PPO | Admitting: Dermatology

## 2024-08-08 ENCOUNTER — Ambulatory Visit: Payer: BC Managed Care – PPO | Admitting: Dermatology

## 2024-08-08 ENCOUNTER — Encounter: Payer: Self-pay | Admitting: Dermatology

## 2024-08-08 DIAGNOSIS — L570 Actinic keratosis: Secondary | ICD-10-CM

## 2024-08-08 DIAGNOSIS — L719 Rosacea, unspecified: Secondary | ICD-10-CM

## 2024-08-08 DIAGNOSIS — W908XXA Exposure to other nonionizing radiation, initial encounter: Secondary | ICD-10-CM | POA: Diagnosis not present

## 2024-08-08 DIAGNOSIS — Z1283 Encounter for screening for malignant neoplasm of skin: Secondary | ICD-10-CM

## 2024-08-08 DIAGNOSIS — Z7189 Other specified counseling: Secondary | ICD-10-CM

## 2024-08-08 DIAGNOSIS — L603 Nail dystrophy: Secondary | ICD-10-CM

## 2024-08-08 DIAGNOSIS — Z79899 Other long term (current) drug therapy: Secondary | ICD-10-CM

## 2024-08-08 DIAGNOSIS — L233 Allergic contact dermatitis due to drugs in contact with skin: Secondary | ICD-10-CM

## 2024-08-08 DIAGNOSIS — L814 Other melanin hyperpigmentation: Secondary | ICD-10-CM

## 2024-08-08 DIAGNOSIS — D1801 Hemangioma of skin and subcutaneous tissue: Secondary | ICD-10-CM | POA: Diagnosis not present

## 2024-08-08 DIAGNOSIS — Z86018 Personal history of other benign neoplasm: Secondary | ICD-10-CM

## 2024-08-08 DIAGNOSIS — Z872 Personal history of diseases of the skin and subcutaneous tissue: Secondary | ICD-10-CM

## 2024-08-08 DIAGNOSIS — L578 Other skin changes due to chronic exposure to nonionizing radiation: Secondary | ICD-10-CM

## 2024-08-08 DIAGNOSIS — L989 Disorder of the skin and subcutaneous tissue, unspecified: Secondary | ICD-10-CM

## 2024-08-08 DIAGNOSIS — D229 Melanocytic nevi, unspecified: Secondary | ICD-10-CM

## 2024-08-08 NOTE — Patient Instructions (Addendum)

## 2024-08-08 NOTE — Progress Notes (Signed)
 Follow-Up Visit   Subjective  Nathaniel Gonzalez is a 58 y.o. male who presents for the following: Skin Cancer Screening and Full Body Skin Exam, hx of Dysplastic nevus,  Check his feet hx of peeling was prescribed Ketoconazole  cream in the past, he stopped Ketoconazole  cream because it burned his feet.   The patient presents for Total-Body Skin Exam (TBSE) for skin cancer screening and mole check. The patient has spots, moles and lesions to be evaluated, some may be new or changing and the patient may have concern these could be cancer.  The following portions of the chart were reviewed this encounter and updated as appropriate: medications, allergies, medical history  Review of Systems:  No other skin or systemic complaints except as noted in HPI or Assessment and Plan.  Objective  Well appearing patient in no apparent distress; mood and affect are within normal limits.  A full examination was performed including scalp, head, eyes, ears, nose, lips, neck, chest, axillae, abdomen, back, buttocks, bilateral upper extremities, bilateral lower extremities, hands, feet, fingers, toes, fingernails, and toenails. All findings within normal limits unless otherwise noted below.   Relevant physical exam findings are noted in the Assessment and Plan.  nose x 1 Erythematous thin papules/macules with gritty scale.      Assessment & Plan   SKIN CANCER SCREENING PERFORMED TODAY.  ACTINIC DAMAGE - Chronic condition, secondary to cumulative UV/sun exposure - diffuse scaly erythematous macules with underlying dyspigmentation - Recommend daily broad spectrum sunscreen SPF 30+ to sun-exposed areas, reapply every 2 hours as needed.  - Staying in the shade or wearing long sleeves, sun glasses (UVA+UVB protection) and wide brim hats (4-inch brim around the entire circumference of the hat) are also recommended for sun protection.  - Call for new or changing lesions.  LENTIGINES, SEBORRHEIC  KERATOSES, HEMANGIOMAS - Benign normal skin lesions - Benign-appearing - Call for any changes  MELANOCYTIC NEVI - Tan-brown and/or pink-flesh-colored symmetric macules and papules - Benign appearing on exam today - Observation - Call clinic for new or changing moles - Recommend daily use of broad spectrum spf 30+ sunscreen to sun-exposed areas.   ROSACEA Exam Mid face erythema with telangiectasias Chronic and persistent condition with duration or expected duration over one year. Condition is symptomatic/ bothersome to patient. Not currently at goal.  Rosacea is a chronic progressive skin condition usually affecting the face of adults, causing redness and/or acne bumps. It is treatable but not curable. It sometimes affects the eyes (ocular rosacea) as well. It may respond to topical and/or systemic medication and can flare with stress, sun exposure, alcohol, exercise, topical steroids (including hydrocortisone/cortisone 10) and some foods.  Daily application of broad spectrum spf 30+ sunscreen to face is recommended to reduce flares. 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 Patient opted for topical treatment Recommended OTC butenafine (Lotrimin Ultra) or terbinafine  cream PRN. (Patient with allergy to topical ketoconazole  cream)    HISTORY  OF DYSPLASTIC NEVUS No evidence of recurrence today Recommend regular full body skin exams Recommend daily broad  spectrum sunscreen SPF 30+ to sun-exposed areas, reapply every 2 hours as needed.  Call if any new or changing lesions are noted between office visits   AK (ACTINIC KERATOSIS) nose x 1 ACTINIC DAMAGE - chronic, secondary to cumulative UV radiation exposure/sun exposure over time - diffuse scaly erythematous macules with underlying dyspigmentation - Recommend daily broad spectrum sunscreen SPF 30+ to sun-exposed areas, reapply every 2 hours as needed.  - Recommend staying in the shade or wearing long sleeves, sun glasses (UVA+UVB protection) and wide brim hats (4-inch brim around the entire circumference of the hat). - Call for new or changing lesions.  Destruction of lesion - nose x 1 Complexity: simple   Destruction method: cryotherapy   Informed consent: discussed and consent obtained   Timeout:  patient name, date of birth, surgical site, and procedure verified Lesion destroyed using liquid nitrogen: Yes   Region frozen until ice ball extended beyond lesion: Yes   Outcome: patient tolerated procedure well with no complications   Post-procedure details: wound care instructions given      Return in about 1 year (around 08/08/2025) for TBSE, hx of Dysplastic nevus .  IFay Kirks, CMA, am acting as scribe for Alm Rhyme, MD .   Documentation: I have reviewed the above documentation for accuracy and completeness, and I agree with the above.  Alm Rhyme, MD

## 2025-08-14 ENCOUNTER — Ambulatory Visit: Admitting: Dermatology
